# Patient Record
Sex: Female | Born: 1955 | Race: White | Hispanic: No | Marital: Married | Smoking: Former smoker
Health system: Southern US, Community
[De-identification: ages and names within clinical notes are randomized; demographics above are authoritative.]

## PROBLEM LIST (undated history)

## (undated) DIAGNOSIS — G4730 Sleep apnea, unspecified: Secondary | ICD-10-CM

## (undated) DIAGNOSIS — Z9889 Other specified postprocedural states: Secondary | ICD-10-CM

## (undated) DIAGNOSIS — E119 Type 2 diabetes mellitus without complications: Secondary | ICD-10-CM

## (undated) DIAGNOSIS — Z87442 Personal history of urinary calculi: Secondary | ICD-10-CM

## (undated) DIAGNOSIS — M1712 Unilateral primary osteoarthritis, left knee: Secondary | ICD-10-CM

## (undated) DIAGNOSIS — K219 Gastro-esophageal reflux disease without esophagitis: Secondary | ICD-10-CM

## (undated) DIAGNOSIS — I10 Essential (primary) hypertension: Secondary | ICD-10-CM

## (undated) DIAGNOSIS — M2540 Effusion, unspecified joint: Secondary | ICD-10-CM

## (undated) DIAGNOSIS — J189 Pneumonia, unspecified organism: Secondary | ICD-10-CM

## (undated) DIAGNOSIS — Z96659 Presence of unspecified artificial knee joint: Secondary | ICD-10-CM

## (undated) DIAGNOSIS — D649 Anemia, unspecified: Secondary | ICD-10-CM

## (undated) DIAGNOSIS — E785 Hyperlipidemia, unspecified: Secondary | ICD-10-CM

## (undated) DIAGNOSIS — R233 Spontaneous ecchymoses: Secondary | ICD-10-CM

## (undated) DIAGNOSIS — M2550 Pain in unspecified joint: Secondary | ICD-10-CM

## (undated) DIAGNOSIS — K589 Irritable bowel syndrome without diarrhea: Secondary | ICD-10-CM

## (undated) DIAGNOSIS — I2510 Atherosclerotic heart disease of native coronary artery without angina pectoris: Secondary | ICD-10-CM

## (undated) DIAGNOSIS — K5900 Constipation, unspecified: Secondary | ICD-10-CM

## (undated) DIAGNOSIS — G4700 Insomnia, unspecified: Secondary | ICD-10-CM

## (undated) DIAGNOSIS — R51 Headache: Secondary | ICD-10-CM

## (undated) DIAGNOSIS — R011 Cardiac murmur, unspecified: Secondary | ICD-10-CM

## (undated) DIAGNOSIS — M1990 Unspecified osteoarthritis, unspecified site: Secondary | ICD-10-CM

## (undated) DIAGNOSIS — R112 Nausea with vomiting, unspecified: Secondary | ICD-10-CM

## (undated) DIAGNOSIS — R238 Other skin changes: Secondary | ICD-10-CM

## (undated) HISTORY — PX: CORONARY ANGIOPLASTY WITH STENT PLACEMENT: SHX49

## (undated) HISTORY — PX: BREAST LUMPECTOMY: SHX2

## (undated) HISTORY — PX: EYE SURGERY: SHX253

## (undated) HISTORY — PX: CARDIAC CATHETERIZATION: SHX172

## (undated) HISTORY — PX: ABDOMINAL HYSTERECTOMY: SHX81

## (undated) HISTORY — PX: JOINT REPLACEMENT: SHX530

## (undated) HISTORY — PX: OTHER SURGICAL HISTORY: SHX169

## (undated) HISTORY — PX: CHOLECYSTECTOMY: SHX55

## (undated) HISTORY — PX: ESOPHAGOGASTRODUODENOSCOPY: SHX1529

---

## 1985-07-20 HISTORY — PX: LAPAROSCOPY: SHX197

## 1994-07-20 HISTORY — PX: BREAST REDUCTION SURGERY: SHX8

## 1995-07-21 HISTORY — PX: BREAST SURGERY: SHX581

## 2005-07-20 HISTORY — PX: BLADDER REPAIR: SHX76

## 2008-05-18 ENCOUNTER — Inpatient Hospital Stay (HOSPITAL_COMMUNITY): Admission: RE | Admit: 2008-05-18 | Discharge: 2008-05-21 | Payer: Self-pay | Admitting: Orthopedic Surgery

## 2008-05-31 ENCOUNTER — Encounter (INDEPENDENT_AMBULATORY_CARE_PROVIDER_SITE_OTHER): Payer: Self-pay | Admitting: Orthopedic Surgery

## 2008-05-31 ENCOUNTER — Ambulatory Visit (HOSPITAL_COMMUNITY): Admission: RE | Admit: 2008-05-31 | Discharge: 2008-05-31 | Payer: Self-pay | Admitting: Orthopedic Surgery

## 2008-05-31 ENCOUNTER — Ambulatory Visit: Payer: Self-pay | Admitting: Vascular Surgery

## 2008-06-12 ENCOUNTER — Encounter (INDEPENDENT_AMBULATORY_CARE_PROVIDER_SITE_OTHER): Payer: Self-pay | Admitting: Orthopedic Surgery

## 2008-06-12 ENCOUNTER — Ambulatory Visit (HOSPITAL_COMMUNITY): Admission: RE | Admit: 2008-06-12 | Discharge: 2008-06-12 | Payer: Self-pay | Admitting: Orthopedic Surgery

## 2008-07-20 HISTORY — PX: PATELLA REALIGNMENT: SHX2179

## 2009-07-15 ENCOUNTER — Inpatient Hospital Stay (HOSPITAL_COMMUNITY): Admission: RE | Admit: 2009-07-15 | Discharge: 2009-07-18 | Payer: Self-pay | Admitting: Orthopedic Surgery

## 2010-05-04 IMAGING — CR DG CHEST 2V
2 series · 2 of 2 positions shown · non-contrast
Comparison: None

CLINICAL DATA: Preop right total knee arthroplasty.

CHEST - 2 VIEW

[view not recorded (1 of 2)]
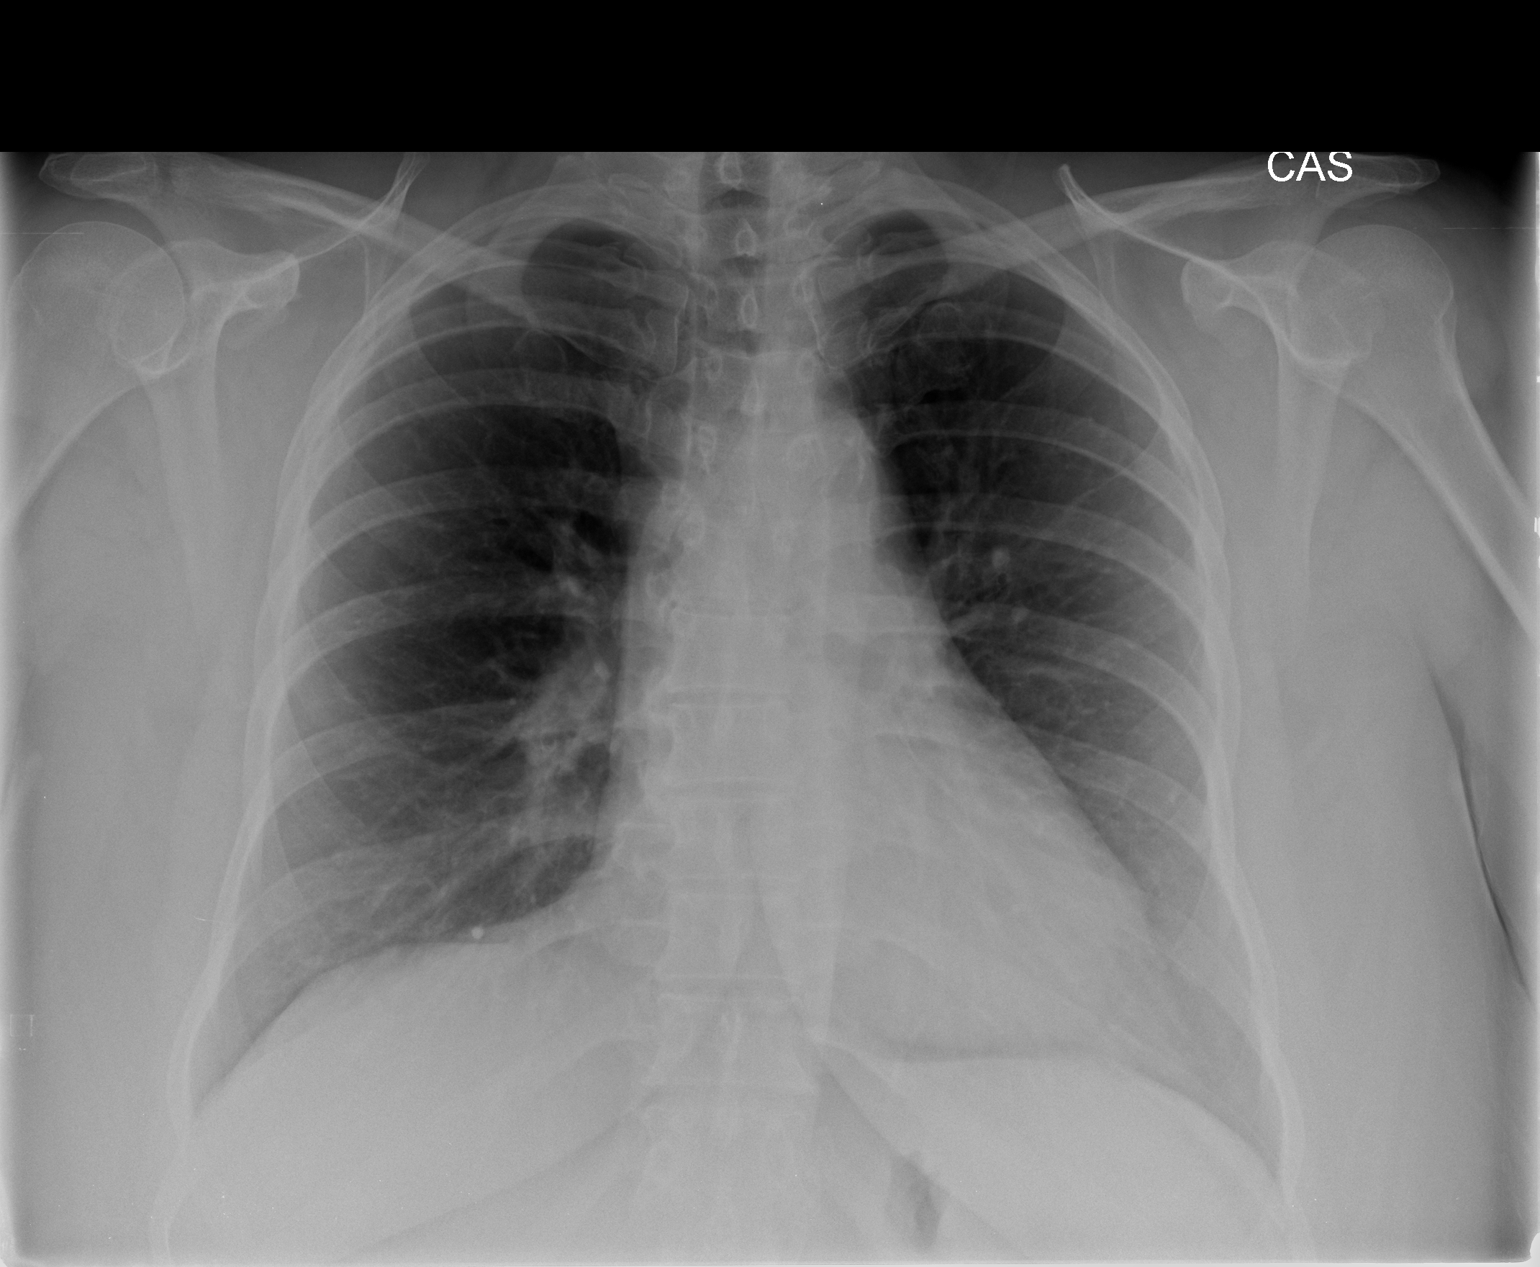

[view not recorded (2 of 2)]
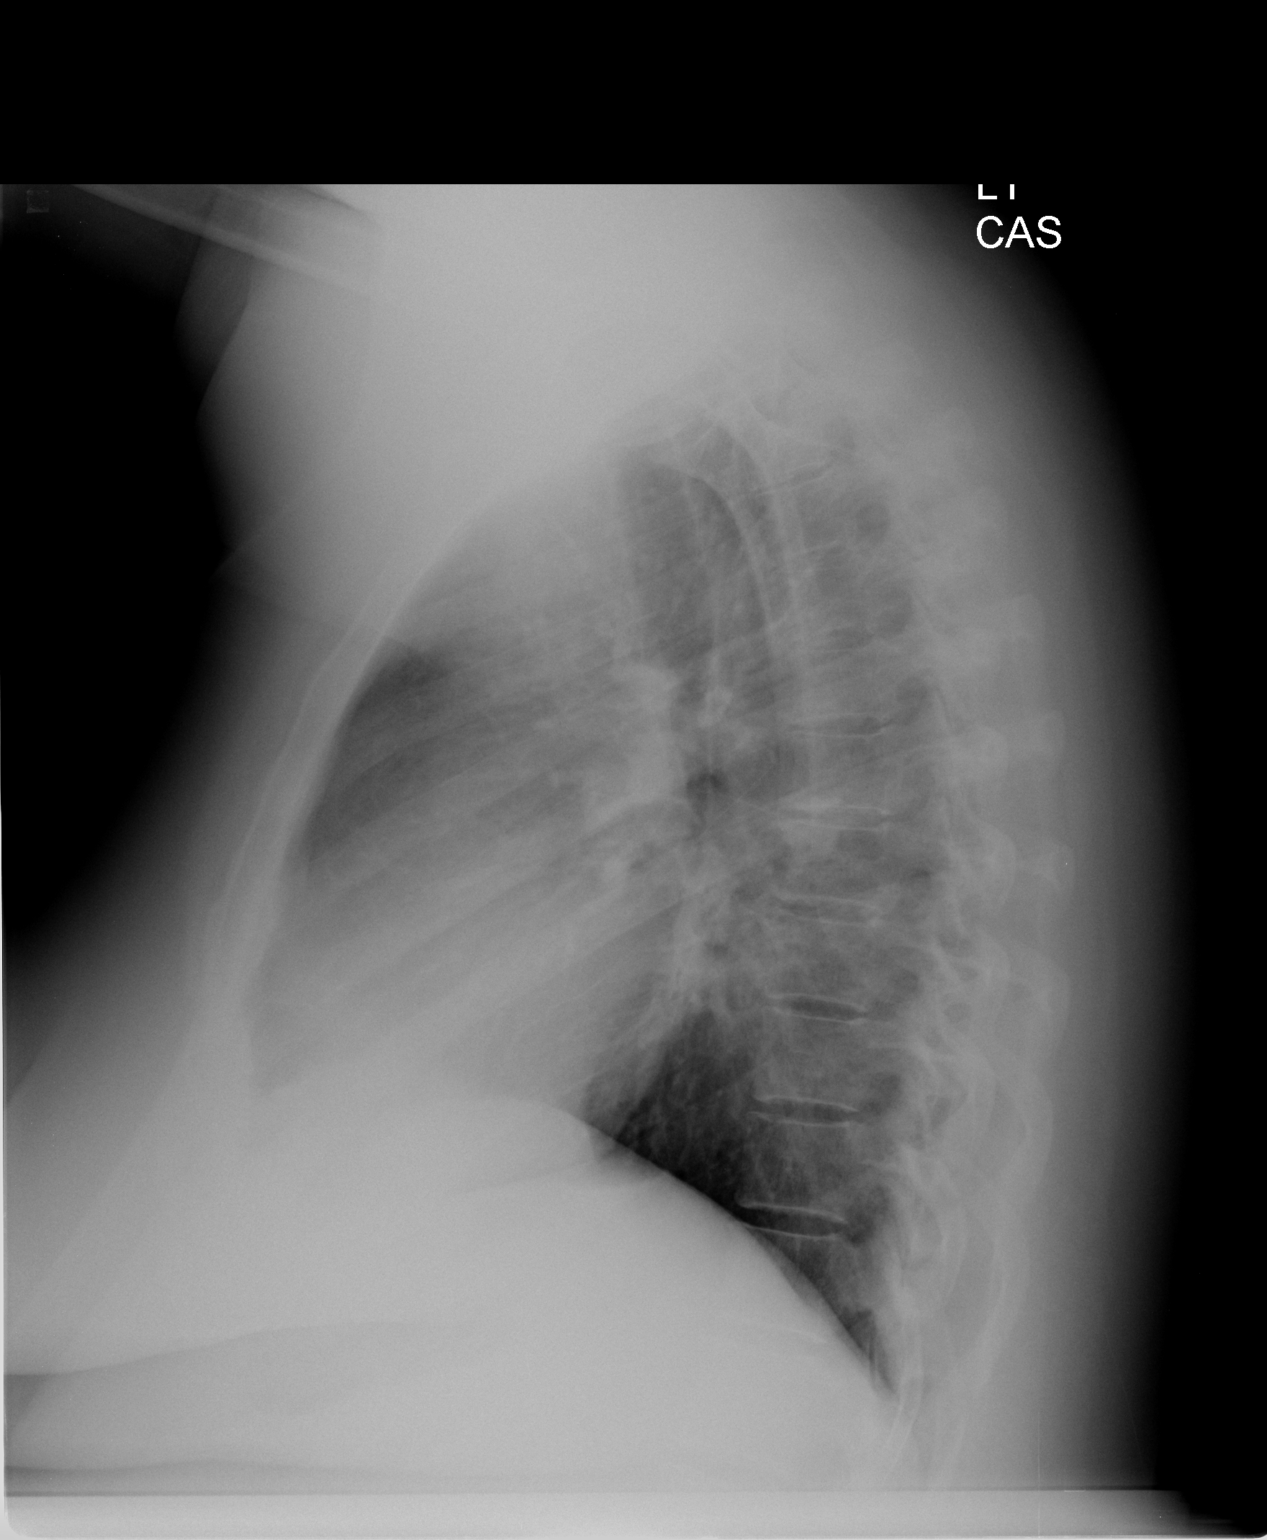

[2 of 2 positions shown; findings below may reference images not displayed]

FINDINGS: Trachea is midline.  Heart is at the upper limits of
normal in size.  Lungs are clear.  No pleural fluid.
IMPRESSION: No acute findings.

## 2010-05-08 IMAGING — CR DG KNEE 1-2V PORT*R*
2 series · 2 of 2 positions shown · non-contrast
Comparison: None available.

CLINICAL DATA: Knee replacement.

PORTABLE RIGHT KNEE - 1-2 VIEW

[view not recorded (1 of 2)]
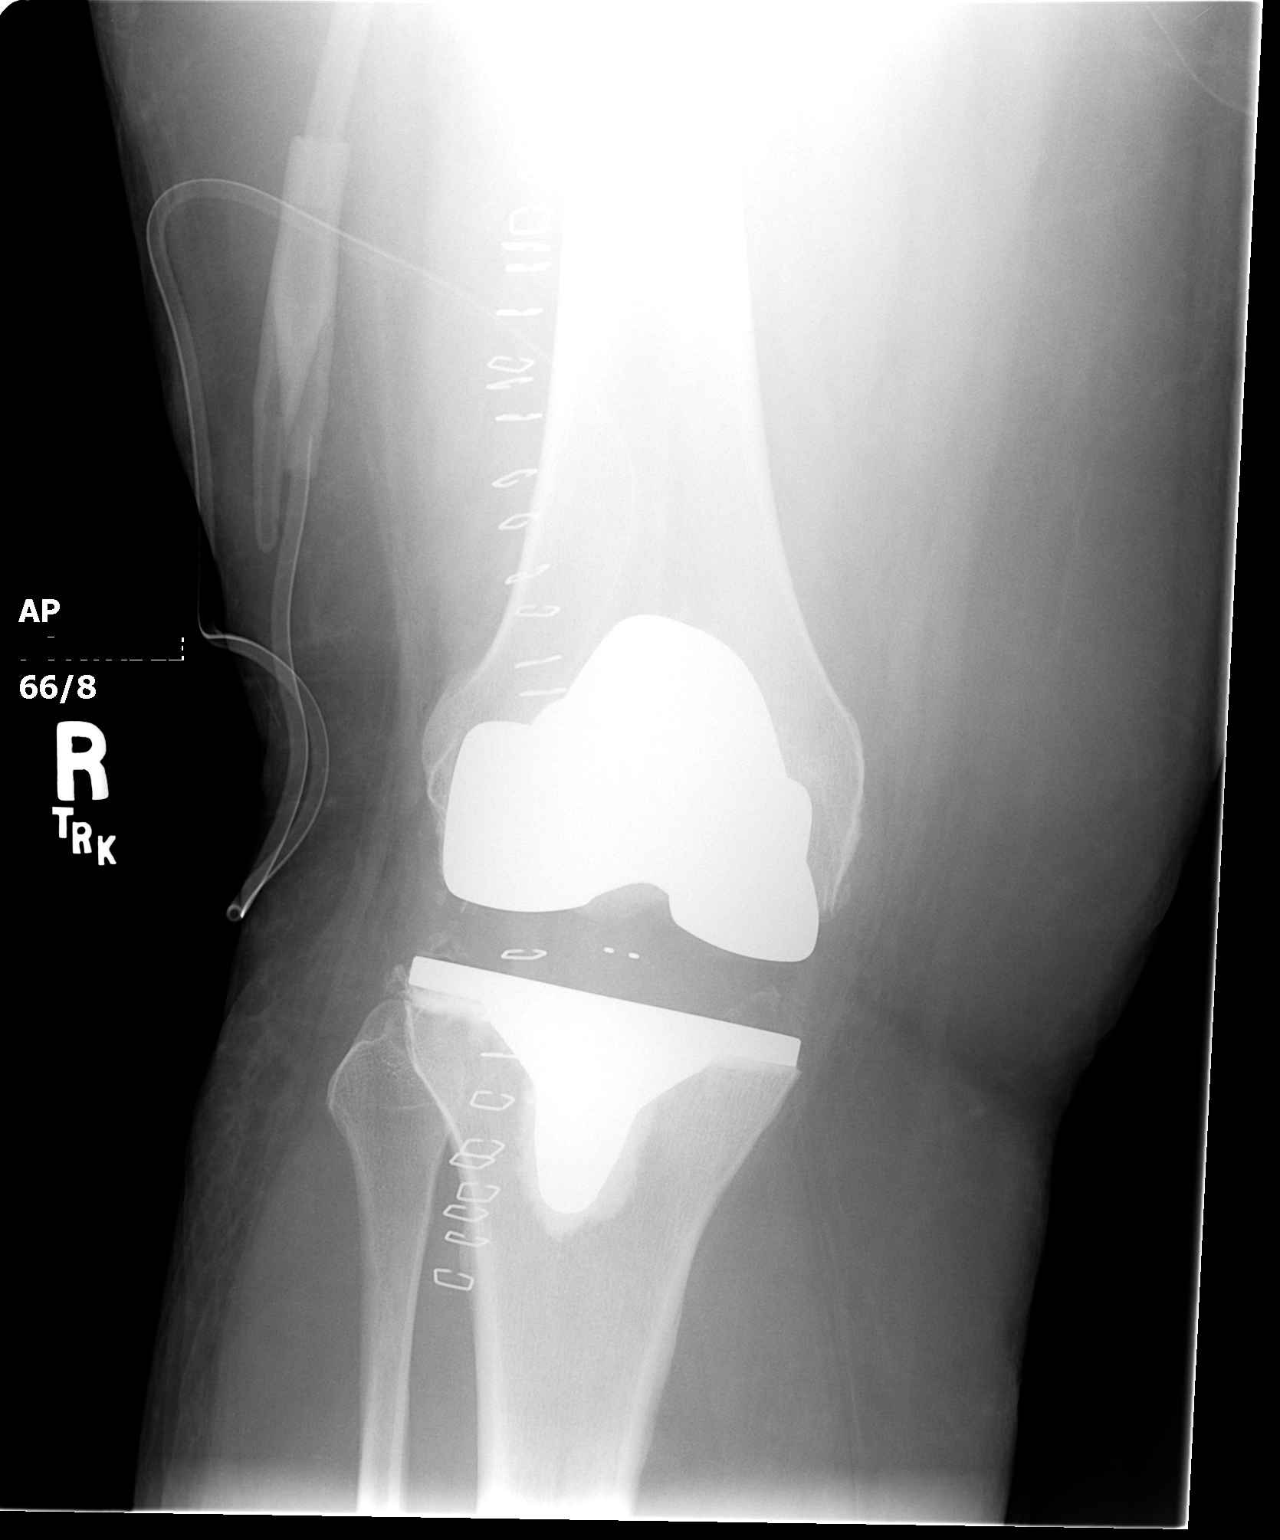

[view not recorded (2 of 2)]
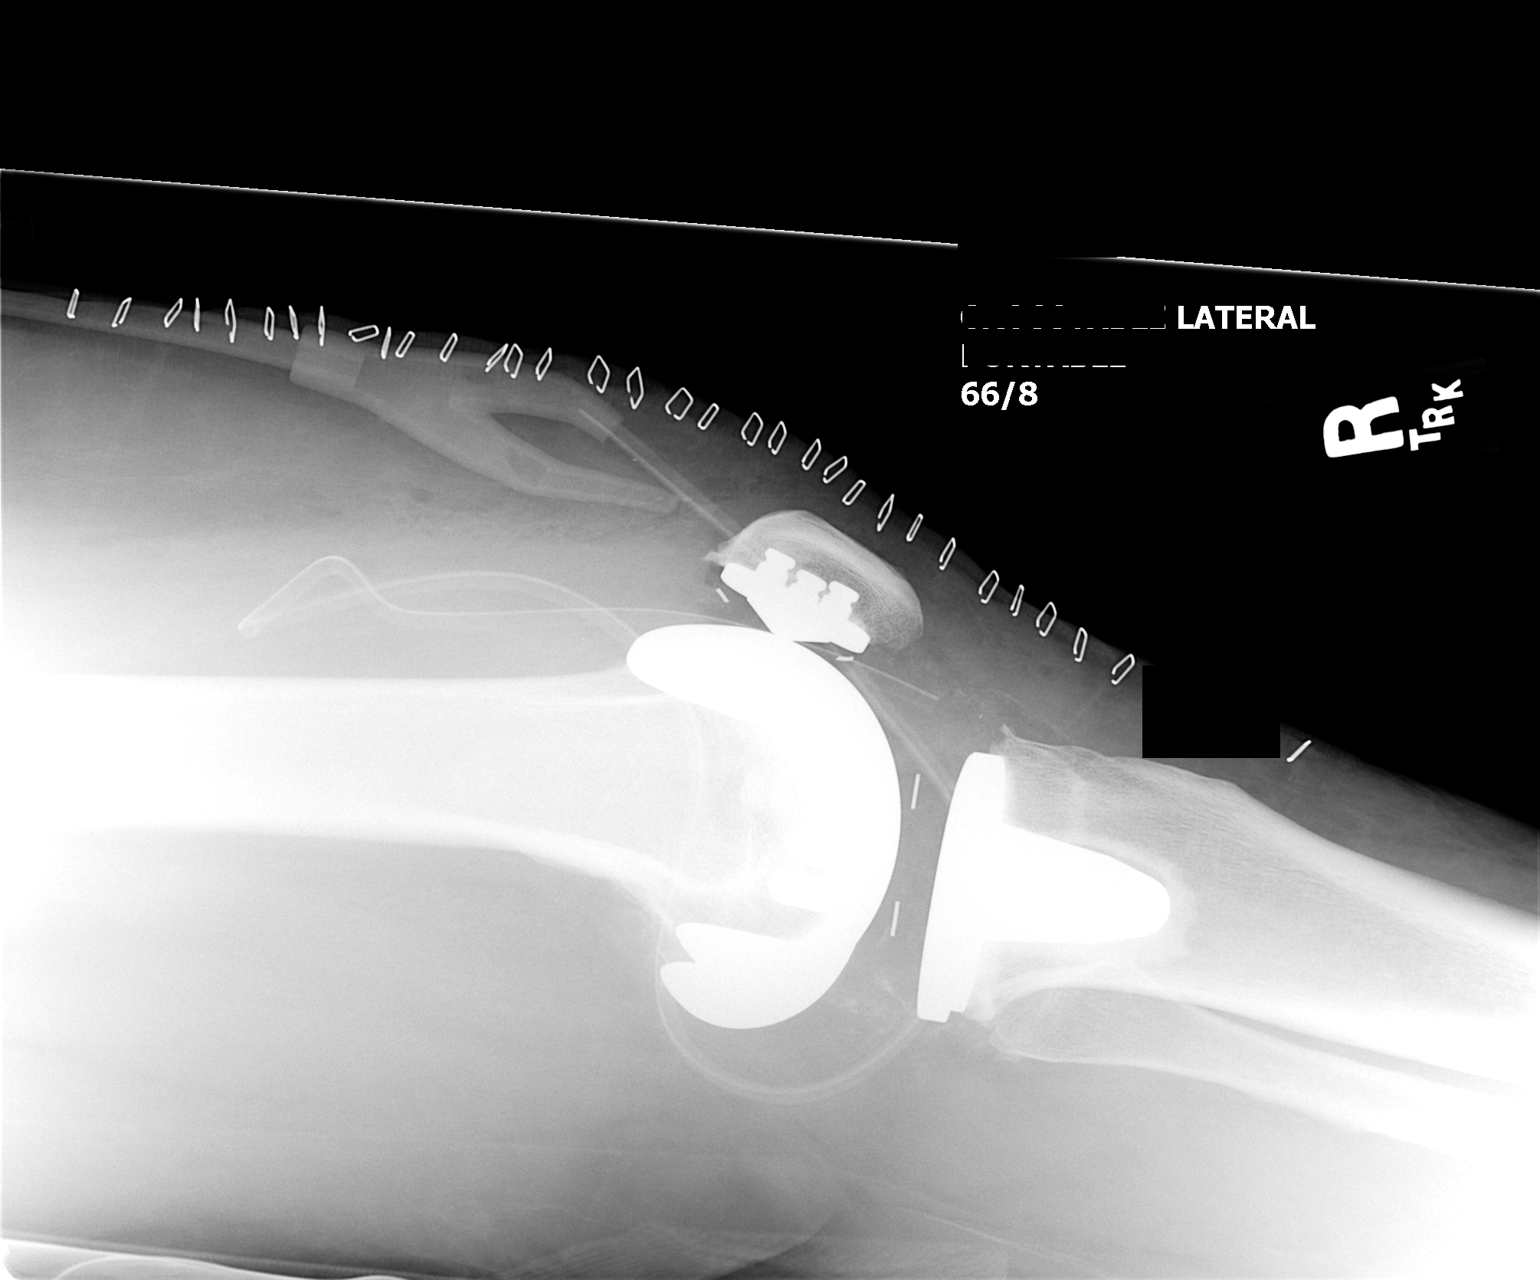

[2 of 2 positions shown; findings below may reference images not displayed]

FINDINGS: The patient has a right total knee arthroplasty with
surgical drain and staples in place.  Device is located and there
is no fracture.
IMPRESSION: Right total knee replacement without evidence of complication.

## 2010-10-20 LAB — URINE CULTURE
Colony Count: NO GROWTH
Culture: NO GROWTH

## 2010-10-20 LAB — URINALYSIS, ROUTINE W REFLEX MICROSCOPIC
Bilirubin Urine: NEGATIVE
Hgb urine dipstick: NEGATIVE
Ketones, ur: NEGATIVE mg/dL
Urobilinogen, UA: 1 mg/dL (ref 0.0–1.0)

## 2010-10-20 LAB — BASIC METABOLIC PANEL
CO2: 29 mEq/L (ref 19–32)
CO2: 31 mEq/L (ref 19–32)
Chloride: 102 mEq/L (ref 96–112)
GFR calc Af Amer: >60 mL/min (ref >60)
GFR calc non Af Amer: >60 mL/min (ref >60)
Glucose, Bld: 132 mg/dL — ABNORMAL HIGH (ref 70–99)
Potassium: 3.3 mEq/L — ABNORMAL LOW (ref 3.5–5.1)
Potassium: 3.7 mEq/L (ref 3.5–5.1)
Sodium: 137 mEq/L (ref 135–145)

## 2010-10-20 LAB — DIFFERENTIAL
Basophils Absolute: 0.1 10*3/uL (ref 0.0–0.1)
Basophils Relative: 1 % (ref 0–1)
Monocytes Absolute: 0.5 10*3/uL (ref 0.1–1.0)
Neutro Abs: 7.2 10*3/uL (ref 1.7–7.7)

## 2010-10-20 LAB — BODY FLUID CULTURE

## 2010-10-20 LAB — COMPREHENSIVE METABOLIC PANEL
ALT: 16 U/L (ref 0–35)
Alkaline Phosphatase: 95 U/L (ref 39–117)
GFR calc Af Amer: >60 mL/min (ref >60)
GFR calc non Af Amer: >60 mL/min (ref >60)
Sodium: 140 mEq/L (ref 135–145)
Total Bilirubin: 0.6 mg/dL (ref 0.3–1.2)
Total Protein: 7.4 g/dL (ref 6.0–8.3)

## 2010-10-20 LAB — CBC
HCT: 29.9 % — ABNORMAL LOW (ref 36.0–46.0)
HCT: 31.8 % — ABNORMAL LOW (ref 36.0–46.0)
HCT: 32.6 % — ABNORMAL LOW (ref 36.0–46.0)
Hemoglobin: 14 g/dL (ref 12.0–15.0)
MCHC: 33.8 g/dL (ref 30.0–36.0)
MCHC: 34.2 g/dL (ref 30.0–36.0)
MCHC: 34.4 g/dL (ref 30.0–36.0)
MCV: 83.3 fL (ref 78.0–100.0)
Platelets: 156 10*3/uL (ref 150–400)
RBC: 3.84 MIL/uL — ABNORMAL LOW (ref 3.87–5.11)
RBC: 3.91 MIL/uL (ref 3.87–5.11)
RBC: 4.99 MIL/uL (ref 3.87–5.11)
RDW: 14.2 % (ref 11.5–15.5)
WBC: 10 10*3/uL (ref 4.0–10.5)
WBC: 11.6 10*3/uL — ABNORMAL HIGH (ref 4.0–10.5)
WBC: 12.5 10*3/uL — ABNORMAL HIGH (ref 4.0–10.5)

## 2010-10-20 LAB — TYPE AND SCREEN: Antibody Screen: NEGATIVE

## 2010-10-20 LAB — ANAEROBIC CULTURE

## 2010-10-20 LAB — PROTIME-INR: INR: 0.93 (ref 0.00–1.49)

## 2010-12-02 NOTE — Op Note (Signed)
NAMEQUANESHA, Abigail Townsend               ACCOUNT NO.:  [id number]   MEDICAL RECORD NO.:  [id number]          PATIENT TYPE:  INP   LOCATION:  5041                         FACILITY:  [facility]   PHYSICIAN:  Dyke Brackett, M.D.    DATE OF BIRTH:  March 13, 1956   DATE OF PROCEDURE:  DATE OF DISCHARGE:                               OPERATIVE REPORT   INDICATION:  A 55 year old female with end-stage arthritis of the right  knee, felt to be amenable to hospitalization for right total knee  replacement.   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   OPERATION:  Right total knee replacement (LCS standard femur with  standard patella, 3-peg size 3 tibia with 50 mm bearing).   SURGEON:  Dyke Brackett, MD   ASSISTANT:  Joslyn Devon. Smith, PA   TOURNIQUET TIME:  Approximately 1 hour and 20 minutes.   DESCRIPTION OF PROCEDURE:  Sterile prep and drape, exsanguination of the  leg, placement of the tourniquet 400, expected skin incision with medial  parapatellar approach was then made and identification of the most  diseased medial compartment with tibial cut, 7 degrees posterior slope  with the appropriate degree of valgus about 2.5 mm below the most  diseased medial compartment.  Stripping and release of the medial side  of the knee was carried out due to the valgus deformity noted.  After  this, the proximal tibial cut and the anterior-posterior femoral cut was  done with a guide system basically flexion gap was measured at 50 mm  followed by distal femoral cut.  A 4-degree valgus made extension gap  matched at 50 mm.  Chamfer cuts were made as well as the keyhole for the  prosthesis and small amounts of osteophytes were removed posterior  aspect of the knee.  PCL was completely the released.  Small remnants of  the menisci were removed.  Tibia was cut with a keyhole touch for the  tibia followed by trial with the patella cut leaving 40 mm from native  patella with 3-peg patella trial with all trials placed tibia and  femoral.  Full extension was noted.  No instability.  Excellent  stability to varus and valgus.  The knee full extended and flexed and  again good stability relative to the bearing.  Trial components were  removed followed by placement of the final components of tibia followed  by femur patella.  Cement was allowed to harden.  Trial bearing had been  placed again.  This was removed.  Tourniquet released.  Small bleeders  were coagulated.  No excess bleeding was noted  also the small amount of cement was removed from the posterior aspect of  the knee.  Final bearing being placed and final stability was checked  and then closure was effected with #1 Ethibond, 2-0 Vicryl skin clips.  Hemovac drain was placed and exited superolaterally in the lateral  gutter, taken to recovery room in stable condition.      Dyke Brackett,  M.D.  Electronically Signed     WDC/MEDQ  D:  05/18/2008  T:  05/18/2008  Job:  [id number]

## 2010-12-05 NOTE — Discharge Summary (Signed)
[name], Townsend               ACCOUNT NO.:  [id number]   MEDICAL RECORD NO.:  [id number]          PATIENT TYPE:  INP   LOCATION:  5041                         FACILITY:  [facility]   PHYSICIAN:  Dyke Brackett, M.D.    DATE OF BIRTH:  1955-10-25   DATE OF ADMISSION:  05/18/2008  DATE OF DISCHARGE:  05/21/2008                               DISCHARGE SUMMARY   ADMISSION DIAGNOSIS:  Right knee osteoarthritis.   FINAL DIAGNOSIS:  Status post right total knee replacement, right knee  osteoarthritis.   PROCEDURE:  Right total knee replacement.   HOSPITAL COURSE:  The patient was admitted to hospital on May 18, 2008 for right total knee replacement.  She was, following the  procedure, transferred to the PACU in stable condition then  to the  orthopedic floor on 5000.  Fisrst postop day, go to May 19, 2008,  the patient was doing well, stable condition, seen by Dr. Madelon Townsend.  No  complaints.  Continuing CPM machine.  Postoperative day #2 was on  May 20, 2008, the patient's hemoglobin was 9.3.  Vital signs stable,  afebrile.  She was not having problems with lightheadedness or  dizziness.  Doing well with physical therapy.  Drain was pulled.  Dressing was changed and CPM0 to 90.  Right lower extremity  neurovascularly intact.  Postoperative day #3 was on May 21, 2008,  the patient was still doing well.  Vital signs were stable, afebrile.  Her hemoglobin was 9.4, calcium was at 3.3.  Right lower extremity  neurovascularly intact.  No chest pain, no trouble breathing.  No signs  of infection, well healing skin incision, dressing was changed.  Abdomen  soft, nontender.  She is ready to go home.  After which, she was  discharged home on May 21, 2008 on postoperative #3.   ASSESSMENT AND PLAN:  The patient is status post right total knee  replacement and discharged to home on May 21, 2008 on postoperative  day #3.  She is 50% weightbearing using a knee immobilizer and  tried to  do a straight leg raise.  Plan for discharge in knee immobilizer for  physical therapy, plan for repeat office visit in 10-12 days for staple  removal.  Diet was regular.  The patient was in stable condition as  mentioned in final diagnosis with right knee osteoarthritis.  Status  post right total knee replacement.   DISCHARGE MEDICATIONS:  1. Percocet 5/325 1 tablet p.o. q.4-6 h. p.r.n. pain.  2. Robaxin 500 mg 1 tablet p.o. q.6-8 h. p.r.n. pain.  3. Lovenox 40 mg subcu 24 hours for another total of 15 days      postoperative.  4. Norvasc 10 mg p.o. daily.  5. Topamax 200 mg b.i.d.  6. Lyrica 150 mg b.i.d.  7. Indocin 50 mg b.i.d.  8. Detrol LA 4 mg daily.  9. Maxzide 37.5 mg daily.  10.K-Dur 20 mEq daily.  11.Zanaflex 2 mg at bedtime.  12.Lipitor 80 mg at bedtime.  13.Vitamin C 1000 mg daily.  14.Vitamin E 400 mg daily.  15.__________ 400 mcg daily.  16.Fish oil 1000 mg daily.  17.Fish oil 2000 mg at bedtime.  18.Multivitamin at bedtime.  19.Loratadine 10 mg daily.  20.Citracal 1000 mg daily.  21.Maxalt 10 mg daily as needed.  22.Climara 0.025 mg topically every week.      Sharol Given, PA      Dyke Brackett, M.D.  Electronically Signed    JBS/MEDQ  D:  07/04/2008  T:  07/05/2008  Job:  [id number]

## 2011-04-21 ENCOUNTER — Other Ambulatory Visit: Payer: Self-pay | Admitting: Orthopedic Surgery

## 2011-04-21 DIAGNOSIS — M25512 Pain in left shoulder: Secondary | ICD-10-CM

## 2011-04-21 LAB — COMPREHENSIVE METABOLIC PANEL
Albumin: 3.9
Alkaline Phosphatase: 89
Calcium: 9.5
Glucose, Bld: 127 — ABNORMAL HIGH
Potassium: 3.4 — ABNORMAL LOW
Total Protein: 7.3

## 2011-04-21 LAB — CBC
HCT: 26.7 — ABNORMAL LOW
HCT: 27.4 — ABNORMAL LOW
Hemoglobin: 10.3 — ABNORMAL LOW
Hemoglobin: 13.9
Hemoglobin: 9.3 — ABNORMAL LOW
Hemoglobin: 9.4 — ABNORMAL LOW
MCHC: 34.2
MCHC: 35.3
MCV: 82.3
MCV: 80.1
Platelets: 159
RBC: 3.67 — ABNORMAL LOW
RBC: 3.29 — ABNORMAL LOW
RDW: 14.9
RDW: 14.9
RDW: 14.4
RDW: 14.6
WBC: 11.4 — ABNORMAL HIGH

## 2011-04-21 LAB — URINALYSIS, ROUTINE W REFLEX MICROSCOPIC
Bilirubin Urine: NEGATIVE
Glucose, UA: NEGATIVE
Hgb urine dipstick: NEGATIVE
Specific Gravity, Urine: 1.017

## 2011-04-21 LAB — BASIC METABOLIC PANEL
BUN: 12
CO2: 29
CO2: 29
Calcium: 8.5
Creatinine, Ser: 0.76
GFR calc Af Amer: >60
GFR calc non Af Amer: >60
GFR calc non Af Amer: >60
GFR calc non Af Amer: >60
Glucose, Bld: 191 — ABNORMAL HIGH
Glucose, Bld: 195 — ABNORMAL HIGH
Potassium: 3.1 — ABNORMAL LOW
Potassium: 3.3 — ABNORMAL LOW
Potassium: 3.3 — ABNORMAL LOW
Sodium: 137

## 2011-04-21 LAB — DIFFERENTIAL
Basophils Absolute: 0.1
Eosinophils Absolute: 0.4
Lymphocytes Relative: 27
Lymphs Abs: 3.1
Monocytes Absolute: 0.4
Neutrophils Relative %: 65

## 2011-04-21 LAB — URINE CULTURE: Colony Count: 6000

## 2011-04-21 LAB — TYPE AND SCREEN: ABO/RH(D): O POS

## 2011-04-21 LAB — PROTIME-INR: Prothrombin Time: 12.3

## 2011-04-26 ENCOUNTER — Ambulatory Visit
Admission: RE | Admit: 2011-04-26 | Discharge: 2011-04-26 | Disposition: A | Discharge status: Home / Self Care | Payer: BC Managed Care – PPO | Source: Ambulatory Visit | Attending: Orthopedic Surgery | Admitting: Orthopedic Surgery

## 2011-04-26 DIAGNOSIS — M25512 Pain in left shoulder: Secondary | ICD-10-CM

## 2011-05-29 ENCOUNTER — Encounter (HOSPITAL_COMMUNITY): Payer: Self-pay | Admitting: Pharmacy Technician

## 2011-06-03 ENCOUNTER — Encounter (HOSPITAL_COMMUNITY)
Admission: RE | Admit: 2011-06-03 | Discharge: 2011-06-03 | Disposition: A | Discharge status: Home / Self Care | Payer: BC Managed Care – PPO | Source: Ambulatory Visit | Attending: Orthopedic Surgery | Admitting: Orthopedic Surgery

## 2011-06-03 ENCOUNTER — Encounter (HOSPITAL_COMMUNITY): Payer: Self-pay

## 2011-06-03 HISTORY — DX: Gastro-esophageal reflux disease without esophagitis: K21.9

## 2011-06-03 HISTORY — DX: Unspecified osteoarthritis, unspecified site: M19.90

## 2011-06-03 HISTORY — DX: Essential (primary) hypertension: I10

## 2011-06-03 HISTORY — DX: Headache: R51

## 2011-06-03 HISTORY — DX: Cardiac murmur, unspecified: R01.1

## 2011-06-03 HISTORY — DX: Sleep apnea, unspecified: G47.30

## 2011-06-03 LAB — BASIC METABOLIC PANEL WITH GFR
BUN: 22 mg/dL (ref 6–23)
CO2: 27 meq/L (ref 19–32)
Calcium: 9.7 mg/dL (ref 8.4–10.5)
Chloride: 105 meq/L (ref 96–112)
Creatinine, Ser: 0.8 mg/dL (ref 0.50–1.10)
GFR calc Af Amer: >90 mL/min
GFR calc non Af Amer: 81 mL/min — ABNORMAL LOW
Glucose, Bld: 148 mg/dL — ABNORMAL HIGH (ref 70–99)
Potassium: 3.9 meq/L (ref 3.5–5.1)
Sodium: 142 meq/L (ref 135–145)

## 2011-06-03 LAB — CBC
MCH: 26.9 pg (ref 26.0–34.0)
MCHC: 32.5 g/dL (ref 30.0–36.0)
MCV: 82.7 fL (ref 78.0–100.0)
Platelets: 165 10*3/uL (ref 150–400)
RDW: 14.6 % (ref 11.5–15.5)

## 2011-06-03 LAB — SURGICAL PCR SCREEN
MRSA, PCR: NEGATIVE
Staphylococcus aureus: POSITIVE — AB

## 2011-06-03 NOTE — Pre-Procedure Instructions (Signed)
[name]  06/03/2011   Your procedure is scheduled on:  Friday 06/05/11   Report to [facility] at 830 AM.  Call this number if you have problems the morning of surgery: [phone]   Remember:   Do not eat food:After Midnight.  Do not drink clear liquids: 4 Hours before arrival.  Take these medicines the morning of surgery with A SIP OF WATER: CLARITIN,  METOPROLOL,  LYRICA, TOPAMAX   Do not wear jewelry, make-up or nail polish.  Do not wear lotions, powders, or perfumes. You may wear deodorant.  Do not shave 48 hours prior to surgery.  Do not bring valuables to the hospital.  Contacts, dentures or bridgework may not be worn into surgery.  Leave suitcase in the car. After surgery it may be brought to your room.  For patients admitted to the hospital, checkout time is 11:00 AM the day of discharge.   Patients discharged the day of surgery will not be allowed to drive home.  Name and phone number of your driver:  MICHAEL   Special Instructions: CHG Shower Use Special Wash: 1/2 bottle night before surgery and 1/2 bottle morning of surgery.   Please read over the following fact sheets that you were given: Pain Booklet and MRSA Information

## 2011-06-03 NOTE — Progress Notes (Signed)
REQUESTED STRESS TEST ALREADY, ALSO REQ EVENT MONITOR TEST, SLEEP STUDY, ECHO FROM [facility]

## 2011-06-04 NOTE — Consult Note (Signed)
Anesthesia:  Patient is a 55 year old female for a left shoulder arthroscopy, decompression, and possible biceps tendon repair.  I was asked to review her EKG and cardiac records.  Her hx is significant for placement of 2 DES (LAD and left CX) in April of this year while staying with family in [place].  She was placed on Effient and baby ASA daily.  Her primary Cardiologist is Dr. Daryel November at [facility] [phone]).  Since her stents were placed she underwent another cardiac cath on 12/02/10 for recurrent CP.  This showed no in-stent restenosis of her stents and "basically near normal coronary arteries" elsewhere, and normal LV systolic function although with evidence of elevated LV end-diastolic pressure possibly due to diastolic dysfunction.  She also had an echocardiogram showing EF 60-65%, normal RV size and systolic function, mild LVH.  As part of her w/u for recurrent CP in May, she also ultimately had a stress test on 04/17/11 which showed no reversible ischemia.  Continued medical therapy was recommended with one year follow-up.  She has had no further chest pain.  After review of her chart and speaking with both her and Dr. Candise Bowens scheduler, it was learned that the patient had been instructed to stop both her ASA and Effient 1 week prior to surgery.  I called and personally spoke with Dr. Daryel November who, at the time, was unaware of the planned procedure.  He had hoped to have Ms. Riggins continue her Effient for a full year before considering its discontinuation.  However, since the Effient had been stopped for nearly a week, he felt the adverse effects, if any, would have already taken place.  He told me that the patient needed to restart her ASA 81 mg today and, in fact, take two pills today and in the morning.  She could then resume her usual dose of ASA and Effient post operatively.  If Dr. Madelon Lips could not perform the planned procedure while the patient was on her ASA  regimen then he felt the procedure should be cancelled.  However, Dr. Hyacinth Meeker did say that if she could resume the ASA regimen as previously mentioned then she could proceed.  I spoke with Dr. Candise Bowens physician assistant Josh who spoke with Dr. Madelon Lips and they will call Ms. Fowers and have her resume her ASA.  I have also spoken with the patient and gave her Dr. Rondel Baton instructions.    Her labs were noted.  I will order a 12 lead EKG pre-operatively, as the EKG copies from [facility] are quite dark.  She is also for a 2V CXR.  Plan to proceed if the patient is compliant with her instructions and if no recurrent CV symptoms.

## 2011-06-04 NOTE — Progress Notes (Signed)
Chart to Shonna Chock PA to review EKG and cardiac notes on chart.

## 2011-06-05 ENCOUNTER — Ambulatory Visit (HOSPITAL_COMMUNITY)
Admission: RE | Admit: 2011-06-05 | Discharge: 2011-06-06 | Disposition: A | Discharge status: Home / Self Care | Payer: BC Managed Care – PPO | Source: Ambulatory Visit | Attending: Orthopedic Surgery | Admitting: Orthopedic Surgery

## 2011-06-05 ENCOUNTER — Encounter (HOSPITAL_COMMUNITY): Payer: Self-pay | Admitting: *Deleted

## 2011-06-05 ENCOUNTER — Other Ambulatory Visit: Payer: Self-pay

## 2011-06-05 ENCOUNTER — Encounter (HOSPITAL_COMMUNITY)
Admission: RE | Disposition: A | Discharge status: Home / Self Care | Payer: Self-pay | Source: Ambulatory Visit | Attending: Orthopedic Surgery

## 2011-06-05 ENCOUNTER — Encounter (HOSPITAL_COMMUNITY): Payer: Self-pay | Admitting: Orthopedic Surgery

## 2011-06-05 ENCOUNTER — Encounter (HOSPITAL_COMMUNITY): Payer: Self-pay | Admitting: Vascular Surgery

## 2011-06-05 ENCOUNTER — Ambulatory Visit (HOSPITAL_COMMUNITY): Payer: BC Managed Care – PPO | Admitting: Vascular Surgery

## 2011-06-05 ENCOUNTER — Ambulatory Visit (HOSPITAL_COMMUNITY): Payer: BC Managed Care – PPO

## 2011-06-05 DIAGNOSIS — G4730 Sleep apnea, unspecified: Secondary | ICD-10-CM | POA: Insufficient documentation

## 2011-06-05 DIAGNOSIS — Z01810 Encounter for preprocedural cardiovascular examination: Secondary | ICD-10-CM | POA: Insufficient documentation

## 2011-06-05 DIAGNOSIS — M1710 Unilateral primary osteoarthritis, unspecified knee: Secondary | ICD-10-CM | POA: Insufficient documentation

## 2011-06-05 DIAGNOSIS — M67919 Unspecified disorder of synovium and tendon, unspecified shoulder: Secondary | ICD-10-CM | POA: Insufficient documentation

## 2011-06-05 DIAGNOSIS — M25819 Other specified joint disorders, unspecified shoulder: Secondary | ICD-10-CM | POA: Insufficient documentation

## 2011-06-05 DIAGNOSIS — R51 Headache: Secondary | ICD-10-CM | POA: Insufficient documentation

## 2011-06-05 DIAGNOSIS — I10 Essential (primary) hypertension: Secondary | ICD-10-CM | POA: Insufficient documentation

## 2011-06-05 DIAGNOSIS — J45909 Unspecified asthma, uncomplicated: Secondary | ICD-10-CM | POA: Insufficient documentation

## 2011-06-05 DIAGNOSIS — I209 Angina pectoris, unspecified: Secondary | ICD-10-CM | POA: Insufficient documentation

## 2011-06-05 DIAGNOSIS — I2510 Atherosclerotic heart disease of native coronary artery without angina pectoris: Secondary | ICD-10-CM | POA: Insufficient documentation

## 2011-06-05 DIAGNOSIS — M7542 Impingement syndrome of left shoulder: Secondary | ICD-10-CM

## 2011-06-05 DIAGNOSIS — M719 Bursopathy, unspecified: Secondary | ICD-10-CM | POA: Insufficient documentation

## 2011-06-05 DIAGNOSIS — Z01812 Encounter for preprocedural laboratory examination: Secondary | ICD-10-CM | POA: Insufficient documentation

## 2011-06-05 DIAGNOSIS — M24119 Other articular cartilage disorders, unspecified shoulder: Secondary | ICD-10-CM | POA: Insufficient documentation

## 2011-06-05 DIAGNOSIS — K219 Gastro-esophageal reflux disease without esophagitis: Secondary | ICD-10-CM | POA: Insufficient documentation

## 2011-06-05 SURGERY — SHOULDER ARTHROSCOPY WITH SUBACROMIAL DECOMPRESSION
Anesthesia: General | Site: Shoulder | Laterality: Left | Wound class: Clean

## 2011-06-05 MED ORDER — ENALAPRIL MALEATE 5 MG PO TABS
5.0000 mg | ORAL_TABLET | Freq: Every day | ORAL | Status: DC
  Administered 2011-06-05: 5 mg via ORAL
  Filled 2011-06-05 (×3): qty 1

## 2011-06-05 MED ORDER — DIPHENHYDRAMINE HCL 12.5 MG/5ML PO ELIX
12.5000 mg | ORAL_SOLUTION | ORAL | Status: DC | PRN
  Filled 2011-06-05: qty 10

## 2011-06-05 MED ORDER — POLYETHYLENE GLYCOL 3350 17 G PO PACK
17.0000 g | PACK | Freq: Every day | ORAL | Status: DC | PRN
  Filled 2011-06-05: qty 1

## 2011-06-05 MED ORDER — PHENYLEPHRINE HCL 10 MG/ML IJ SOLN
20.0000 mg | INTRAVENOUS | Status: DC | PRN
  Administered 2011-06-05: 5 ug/min via INTRAVENOUS

## 2011-06-05 MED ORDER — FENTANYL CITRATE 0.05 MG/ML IJ SOLN
50.0000 ug | INTRAMUSCULAR | Status: DC | PRN
  Administered 2011-06-05: 50 ug via INTRAVENOUS

## 2011-06-05 MED ORDER — LORATADINE 10 MG PO TABS
10.0000 mg | ORAL_TABLET | Freq: Every day | ORAL | Status: DC
  Administered 2011-06-05 – 2011-06-06 (×2): 10 mg via ORAL
  Filled 2011-06-05 (×2): qty 1

## 2011-06-05 MED ORDER — FLEET ENEMA 7-19 GM/118ML RE ENEM: 1.0000 | ENEMA | Freq: Every day | RECTAL | Status: DC | PRN

## 2011-06-05 MED ORDER — TOPIRAMATE 100 MG PO TABS
200.0000 mg | ORAL_TABLET | Freq: Two times a day (BID) | ORAL | Status: DC
  Administered 2011-06-05 – 2011-06-06 (×2): 200 mg via ORAL
  Filled 2011-06-05 (×3): qty 2

## 2011-06-05 MED ORDER — MIDAZOLAM HCL 2 MG/2ML IJ SOLN
INTRAMUSCULAR | Status: AC
  Filled 2011-06-05: qty 2

## 2011-06-05 MED ORDER — ONDANSETRON HCL 4 MG/2ML IJ SOLN
INTRAMUSCULAR | Status: DC | PRN
  Administered 2011-06-05: 4 mg via INTRAVENOUS

## 2011-06-05 MED ORDER — LACTATED RINGERS IV SOLN
INTRAVENOUS | Status: DC | PRN
  Administered 2011-06-05 (×2): via INTRAVENOUS

## 2011-06-05 MED ORDER — OXYCODONE-ACETAMINOPHEN 5-325 MG PO TABS
1.0000 | ORAL_TABLET | ORAL | Status: DC | PRN
  Administered 2011-06-05: 1 via ORAL
  Administered 2011-06-05 – 2011-06-06 (×3): 2 via ORAL
  Filled 2011-06-05 (×3): qty 2
  Filled 2011-06-05: qty 1

## 2011-06-05 MED ORDER — METOPROLOL TARTRATE 25 MG PO TABS
25.0000 mg | ORAL_TABLET | Freq: Every day | ORAL | Status: DC
  Filled 2011-06-05 (×2): qty 1

## 2011-06-05 MED ORDER — TRIAMTERENE-HCTZ 37.5-25 MG PO TABS
1.0000 | ORAL_TABLET | Freq: Every day | ORAL | Status: DC
  Filled 2011-06-05: qty 1

## 2011-06-05 MED ORDER — SODIUM CHLORIDE 0.9 % IV SOLN
INTRAVENOUS | Status: DC
  Administered 2011-06-06: 02:00:00 via INTRAVENOUS

## 2011-06-05 MED ORDER — HYDROMORPHONE HCL PF 1 MG/ML IJ SOLN: 0.5000 mg | INTRAMUSCULAR | Status: DC | PRN

## 2011-06-05 MED ORDER — BISACODYL 10 MG RE SUPP: 10.0000 mg | Freq: Every day | RECTAL | Status: DC | PRN

## 2011-06-05 MED ORDER — OXYCODONE-ACETAMINOPHEN 5-325 MG PO TABS: ORAL_TABLET | ORAL | Status: DC

## 2011-06-05 MED ORDER — METHOCARBAMOL 100 MG/ML IJ SOLN
500.0000 mg | Freq: Four times a day (QID) | INTRAVENOUS | Status: DC | PRN
  Filled 2011-06-05: qty 5

## 2011-06-05 MED ORDER — METOCLOPRAMIDE HCL 5 MG/ML IJ SOLN
5.0000 mg | Freq: Three times a day (TID) | INTRAMUSCULAR | Status: DC | PRN
  Filled 2011-06-05: qty 2

## 2011-06-05 MED ORDER — METHOCARBAMOL 500 MG PO TABS
500.0000 mg | ORAL_TABLET | Freq: Four times a day (QID) | ORAL | Status: DC | PRN
  Administered 2011-06-05 – 2011-06-06 (×2): 500 mg via ORAL
  Filled 2011-06-05 (×2): qty 1

## 2011-06-05 MED ORDER — SUFENTANIL CITRATE 50 MCG/ML IV SOLN
INTRAVENOUS | Status: DC | PRN
  Administered 2011-06-05: 15 ug via INTRAVENOUS

## 2011-06-05 MED ORDER — METHOCARBAMOL 500 MG PO TABS: 500.0000 mg | ORAL_TABLET | Freq: Four times a day (QID) | ORAL | Status: AC

## 2011-06-05 MED ORDER — LACTATED RINGERS IV SOLN
INTRAVENOUS | Status: DC
  Administered 2011-06-05: 10:00:00 via INTRAVENOUS

## 2011-06-05 MED ORDER — ROSUVASTATIN CALCIUM 20 MG PO TABS
20.0000 mg | ORAL_TABLET | Freq: Every day | ORAL | Status: DC
  Administered 2011-06-05: 20 mg via ORAL
  Filled 2011-06-05 (×2): qty 1

## 2011-06-05 MED ORDER — METOCLOPRAMIDE HCL 10 MG PO TABS: 5.0000 mg | ORAL_TABLET | Freq: Three times a day (TID) | ORAL | Status: DC | PRN

## 2011-06-05 MED ORDER — ACETAMINOPHEN 650 MG RE SUPP: 650.0000 mg | Freq: Four times a day (QID) | RECTAL | Status: DC | PRN

## 2011-06-05 MED ORDER — ACETAMINOPHEN 325 MG PO TABS: 650.0000 mg | ORAL_TABLET | Freq: Four times a day (QID) | ORAL | Status: DC | PRN

## 2011-06-05 MED ORDER — NEOSTIGMINE METHYLSULFATE 1 MG/ML IJ SOLN
INTRAMUSCULAR | Status: DC | PRN
  Administered 2011-06-05: 5 mg via INTRAVENOUS

## 2011-06-05 MED ORDER — BUPIVACAINE-EPINEPHRINE PF 0.5-1:200000 % IJ SOLN
INTRAMUSCULAR | Status: DC | PRN
  Administered 2011-06-05: 30 mL

## 2011-06-05 MED ORDER — GLYCOPYRROLATE 0.2 MG/ML IJ SOLN
INTRAMUSCULAR | Status: DC | PRN
  Administered 2011-06-05: .7 mg via INTRAVENOUS

## 2011-06-05 MED ORDER — MIDAZOLAM HCL 2 MG/2ML IJ SOLN
1.0000 mg | INTRAMUSCULAR | Status: DC | PRN
  Administered 2011-06-05: 1 mg via INTRAVENOUS

## 2011-06-05 MED ORDER — ONDANSETRON HCL 4 MG/2ML IJ SOLN: 4.0000 mg | Freq: Four times a day (QID) | INTRAMUSCULAR | Status: DC | PRN

## 2011-06-05 MED ORDER — ASPIRIN EC 81 MG PO TBEC
81.0000 mg | DELAYED_RELEASE_TABLET | Freq: Every day | ORAL | Status: DC
  Administered 2011-06-06: 81 mg via ORAL
  Filled 2011-06-05: qty 1

## 2011-06-05 MED ORDER — CEFAZOLIN SODIUM 1-5 GM-% IV SOLN
INTRAVENOUS | Status: AC
  Filled 2011-06-05: qty 100

## 2011-06-05 MED ORDER — DOCUSATE SODIUM 100 MG PO CAPS
100.0000 mg | ORAL_CAPSULE | Freq: Two times a day (BID) | ORAL | Status: DC
  Administered 2011-06-06: 100 mg via ORAL
  Filled 2011-06-05 (×2): qty 1

## 2011-06-05 MED ORDER — CEFAZOLIN SODIUM 1-5 GM-% IV SOLN
INTRAVENOUS | Status: DC | PRN
  Administered 2011-06-05: 2 g via INTRAVENOUS

## 2011-06-05 MED ORDER — ASPIRIN 81 MG PO TABS: 81.0000 mg | ORAL_TABLET | Freq: Every day | ORAL | Status: DC

## 2011-06-05 MED ORDER — ESTRADIOL 0.025 MG/24HR TD PTWK
0.0250 mg | MEDICATED_PATCH | TRANSDERMAL | Status: DC
  Administered 2011-06-05: 0.025 mg via TRANSDERMAL
  Filled 2011-06-05: qty 1

## 2011-06-05 MED ORDER — FENTANYL CITRATE 0.05 MG/ML IJ SOLN
INTRAMUSCULAR | Status: AC
  Filled 2011-06-05: qty 2

## 2011-06-05 MED ORDER — ROCURONIUM BROMIDE 100 MG/10ML IV SOLN
INTRAVENOUS | Status: DC | PRN
  Administered 2011-06-05: 50 mg via INTRAVENOUS

## 2011-06-05 MED ORDER — ONDANSETRON HCL 4 MG PO TABS: 4.0000 mg | ORAL_TABLET | Freq: Four times a day (QID) | ORAL | Status: DC | PRN

## 2011-06-05 MED ORDER — HYDROMORPHONE HCL PF 1 MG/ML IJ SOLN: 0.2500 mg | INTRAMUSCULAR | Status: DC | PRN

## 2011-06-05 MED ORDER — MAGNESIUM HYDROXIDE 400 MG/5ML PO SUSP: 30.0000 mL | Freq: Two times a day (BID) | ORAL | Status: DC | PRN

## 2011-06-05 MED ORDER — PHENOL 1.4 % MT LIQD
1.0000 | OROMUCOSAL | Status: DC | PRN
  Filled 2011-06-05: qty 177

## 2011-06-05 MED ORDER — CEFAZOLIN SODIUM-DEXTROSE 2-3 GM-% IV SOLR
2.0000 g | Freq: Four times a day (QID) | INTRAVENOUS | Status: AC
  Administered 2011-06-05 – 2011-06-06 (×2): 2 g via INTRAVENOUS
  Filled 2011-06-05 (×2): qty 50

## 2011-06-05 MED ORDER — SODIUM CHLORIDE 0.9 % IR SOLN
Status: DC | PRN
  Administered 2011-06-05: 3000 mL

## 2011-06-05 MED ORDER — PROPOFOL 10 MG/ML IV EMUL
INTRAVENOUS | Status: DC | PRN
  Administered 2011-06-05: 180 mg via INTRAVENOUS
  Administered 2011-06-05: 1 mg via INTRAVENOUS

## 2011-06-05 MED ORDER — BISACODYL 5 MG PO TBEC: 10.0000 mg | DELAYED_RELEASE_TABLET | Freq: Every day | ORAL | Status: DC | PRN

## 2011-06-05 MED ORDER — MENTHOL 3 MG MT LOZG: 1.0000 | LOZENGE | OROMUCOSAL | Status: DC | PRN

## 2011-06-05 SURGICAL SUPPLY — 53 items
BIT DRILL TAK (DRILL) IMPLANT
BLADE CUDA 5.5 (BLADE) IMPLANT
BLADE GREAT WHITE 4.2 (BLADE) ×3 IMPLANT
BLADE LONG MED 31X9 (MISCELLANEOUS) IMPLANT
BLADE SURG 11 STRL SS (BLADE) ×3 IMPLANT
BUR OVAL 6.0 (BURR) ×3 IMPLANT
CANNULA SHOULDER 7CM (CANNULA) ×3 IMPLANT
CLOTH BEACON ORANGE TIMEOUT ST (SAFETY) ×3 IMPLANT
DRAPE STERI 35X30 U-POUCH (DRAPES) ×3 IMPLANT
DRAPE SURG 17X23 STRL (DRAPES) IMPLANT
DRAPE U-SHAPE 47X51 STRL (DRAPES) IMPLANT
DRILL TAK (DRILL)
DRSG ADAPTIC 3X8 NADH LF (GAUZE/BANDAGES/DRESSINGS) ×3 IMPLANT
DRSG PAD ABDOMINAL 8X10 ST (GAUZE/BANDAGES/DRESSINGS) ×3 IMPLANT
DURAPREP 26ML APPLICATOR (WOUND CARE) ×3 IMPLANT
GLOVE BIOGEL PI IND STRL 8 (GLOVE) ×4 IMPLANT
GLOVE BIOGEL PI INDICATOR 8 (GLOVE) ×2
GLOVE ORTHO TXT STRL SZ7.5 (GLOVE) ×6 IMPLANT
GLOVE SURG ORTHO 8.0 STRL STRW (GLOVE) ×9 IMPLANT
GOWN PREVENTION PLUS XLARGE (GOWN DISPOSABLE) ×6 IMPLANT
GOWN STRL NON-REIN LRG LVL3 (GOWN DISPOSABLE) ×3 IMPLANT
IV NS IRRIG 3000ML ARTHROMATIC (IV SOLUTION) ×12 IMPLANT
KIT BASIN OR (CUSTOM PROCEDURE TRAY) ×3 IMPLANT
KIT ROOM TURNOVER OR (KITS) ×3 IMPLANT
MANIFOLD NEPTUNE II (INSTRUMENTS) ×3 IMPLANT
NEEDLE 22X1 1/2 (OR ONLY) (NEEDLE) ×3 IMPLANT
NEEDLE SPNL 18GX3.5 QUINCKE PK (NEEDLE) ×3 IMPLANT
NS IRRIG 1000ML POUR BTL (IV SOLUTION) ×3 IMPLANT
PACK SHOULDER (CUSTOM PROCEDURE TRAY) ×3 IMPLANT
SET ARTHROSCOPY TUBING (MISCELLANEOUS) ×2
SET ARTHROSCOPY TUBING LN (MISCELLANEOUS) ×4 IMPLANT
SLING ARM IMMOBILIZER XL (CAST SUPPLIES) ×3 IMPLANT
SPEAR FASTAKII (SLEEVE) IMPLANT
SPONGE GAUZE 4X4 12PLY (GAUZE/BANDAGES/DRESSINGS) ×6 IMPLANT
SPONGE GAUZE 4X4 FOR O.R. (GAUZE/BANDAGES/DRESSINGS) ×3 IMPLANT
SPONGE LAP 4X18 X RAY DECT (DISPOSABLE) ×6 IMPLANT
SUT ETHIBOND NAB CT1 #1 30IN (SUTURE) IMPLANT
SUT ETHILON 3 0 PS 1 (SUTURE) ×3 IMPLANT
SUT ETHILON 4 0 PS 2 18 (SUTURE) ×3 IMPLANT
SUT FIBERWIRE #2 38 T-5 BLUE (SUTURE)
SUT MNCRL AB 3-0 PS2 18 (SUTURE) ×3 IMPLANT
SUT VIC AB 0 CT1 27 (SUTURE)
SUT VIC AB 0 CT1 27XBRD ANBCTR (SUTURE) IMPLANT
SUT VIC AB 2-0 CT1 27 (SUTURE)
SUT VIC AB 2-0 CT1 TAPERPNT 27 (SUTURE) IMPLANT
SUTURE FIBERWR #2 38 T-5 BLUE (SUTURE) IMPLANT
SYR 20ML ECCENTRIC (SYRINGE) IMPLANT
SYR CONTROL 10ML LL (SYRINGE) ×3 IMPLANT
TAPE CLOTH SURG 6X10 WHT LF (GAUZE/BANDAGES/DRESSINGS) ×3 IMPLANT
TOWEL OR 17X24 6PK STRL BLUE (TOWEL DISPOSABLE) ×3 IMPLANT
TOWEL OR 17X26 10 PK STRL BLUE (TOWEL DISPOSABLE) ×3 IMPLANT
WAND 90 DEG TURBOVAC W/CORD (SURGICAL WAND) ×6 IMPLANT
WATER STERILE IRR 1000ML POUR (IV SOLUTION) ×3 IMPLANT

## 2011-06-05 NOTE — Transfer of Care (Signed)
Immediate Anesthesia Transfer of Care Note  Patient: Abigail Townsend  Procedure(s) Performed:  SHOULDER ARTHROSCOPY WITH SUBACROMIAL DECOMPRESSION - shoulder arthroscopy with labral debridment and subacromial decomproession  Patient Location: PACU  Anesthesia Type: General and Regional  Level of Consciousness: awake and patient cooperative  Airway & Oxygen Therapy: Patient Spontanous Breathing and Patient connected to nasal cannula oxygen  Post-op Assessment: Report given to PACU RN, Post -op Vital signs reviewed and stable, Patient moving all extremities and Patient moving all extremities X 4  Post vital signs: Reviewed and stable  Complications: No apparent anesthesia complicationsNo apparent anesthesia complications

## 2011-06-05 NOTE — Anesthesia Procedure Notes (Signed)
Anesthesia Regional Block:  Interscalene brachial plexus block  Pre-Anesthetic Checklist: ,, timeout performed, Correct Patient, Correct Site, Correct Laterality, Correct Procedure, Correct Position, site marked, Risks and benefits discussed,  Surgical consent,  Pre-op evaluation,  At surgeon's request and post-op pain management  Laterality: Left  Prep: chloraprep       Needles:  Injection technique: Single-shot  Needle Type: Echogenic Stimulator Needle     Needle Length: 5cm 5 cm Needle Gauge: 22 and 22 G    Additional Needles:  Procedures: ultrasound guided and nerve stimulator Interscalene brachial plexus block  Nerve Stimulator or Paresthesia:  Response: biceps flexion, 0.45 mA,   Additional Responses:   Narrative:  Start time: 06/05/2011 10:30 AM End time: 06/05/2011 10:43 AM Injection made incrementally with aspirations every 5 mL.  Performed by: Personally  Anesthesiologist: Dr Chaney Malling  Additional Notes: Functioning IV was confirmed and monitors were applied.  A 50mm 22ga Arrow echogenic stimulator needle was used. Sterile prep and drape,hand hygiene and sterile gloves were used.  Negative aspiration and negative test dose prior to incremental administration of local anesthetic. The patient tolerated the procedure well.  Ultrasound guidance: relevent anatomy identified, needle position confirmed, local anesthetic spread visualized around nerve(s), vascular puncture avoided.  Image printed for medical record.   Interscalene brachial plexus block

## 2011-06-05 NOTE — Anesthesia Preprocedure Evaluation (Addendum)
Anesthesia Evaluation  Patient identified by MRN, date of birth, ID band Patient awake    Reviewed: Allergy & Precautions, H&P , NPO status , Patient's Chart, lab work & pertinent test results  Airway Mallampati: II  Neck ROM: full    Dental   Pulmonary asthma , sleep apnea ,          Cardiovascular hypertension, + angina + CAD + Valvular Problems/Murmurs     Neuro/Psych  Headaches,    GI/Hepatic GERD-  ,  Endo/Other    Renal/GU      Musculoskeletal   Abdominal   Peds  Hematology   Anesthesia Other Findings   Reproductive/Obstetrics                           Anesthesia Physical Anesthesia Plan  ASA: III  Anesthesia Plan: General   Post-op Pain Management: MAC Combined w/ Regional for Post-op pain   Induction: Intravenous  Airway Management Planned: Oral ETT  Additional Equipment:   Intra-op Plan:   Post-operative Plan: Extubation in OR  Informed Consent: I have reviewed the patients History and Physical, chart, labs and discussed the procedure including the risks, benefits and alternatives for the proposed anesthesia with the patient or authorized representative who has indicated his/her understanding and acceptance.   Dental advisory given  Plan Discussed with: CRNA, Surgeon and Anesthesiologist  Anesthesia Plan Comments:        Anesthesia Quick Evaluation

## 2011-06-05 NOTE — Brief Op Note (Signed)
06/05/2011  1:18 PM  PATIENT:  Abigail Townsend  55 y.o. female  PRE-OPERATIVE DIAGNOSIS:  left shoulder subacromial bursitis, symptomatic os acromiale   POST-OPERATIVE DIAGNOSIS:  left shoulder subacromial bursitis, symptomatic os acromiale   PROCEDURE:  Procedure(s): SHOULDER ARTHROSCOPY WITH SUBACROMIAL DECOMPRESSION  SURGEON:  Surgeon(s): Thera Flake., MD  PHYSICIAN ASSISTANT: Margart Sickles, PA-C   ANESTHESIA:   general  EBL:  Total I/O In: 1000 [I.V.:1000] Out: -   BLOOD ADMINISTERED:none  DRAINS: none   LOCAL MEDICATIONS USED:  NONE  SPECIMEN:  No Specimen  DISPOSITION OF SPECIMEN:  N/A  COUNTS:  YES  TOURNIQUET:  * No tourniquets in log *  DICTATION: .Note written in EPIC  PLAN OF CARE: Admit for overnight observation  PATIENT DISPOSITION:  PACU - hemodynamically stable.   First dose of ASA 162mg  tonight

## 2011-06-05 NOTE — Op Note (Signed)
Abigail Townsend, Abigail Townsend NO.:  [id number]  MEDICAL RECORD NO.:  [id number]  LOCATION:  5034                         FACILITY:  [facility]  PHYSICIAN:  Dyke Brackett, M.D.    DATE OF BIRTH:  11/29/1955  DATE OF PROCEDURE:  06/05/2011 DATE OF DISCHARGE:                              OPERATIVE REPORT   PREOPERATIVE DIAGNOSIS:  Impingement with labral tear.  POSTOPERATIVE DIAGNOSIS:  Impingement with labral tear.  OPERATION: 1. Arthroscopic debridement of torn labrum. 2. Arthroscopic acromioplasty, all for the left shoulder.  DESCRIPTION OF PROCEDURE:  Sterile prep and drape, beach-chair positioning, general anesthetic with the nerve block, posterior approach to the shoulder blade and pierced portal laterally and anterior portal. She intra-articularly had nonspecific synovitis, debrided.  We gently entered the anterior superior labrum that was well debrided.  The undersurface of the cuff was normal.  The bursa was extremely inflamed, hypertrophied, and we resected the bursa.  We performed an acromioplasty.  It was noted that she had a rather large os acromiale preoperatively and we could visualize this.  It seemed to project anterior and inferior.  Obviously, we did not want to do anything to destabilize that.  We performed an acromioplasty and actually resected the most prominent anterior-inferior part of the os as well, which seemed to be impinging and this opened up the subacromial space additionally.  AC joint was not violated.  Bursectomy carried out. Shoulder drained free of fluid, porous-coated nylon, taken to recovery room in stable condition with sling and sterile dressings.     Dyke Brackett, M.D.     WDC/MEDQ  D:  06/05/2011  T:  06/05/2011  Job:  [id number]

## 2011-06-05 NOTE — Interval H&P Note (Signed)
History and Physical Interval Note:   06/05/2011   11:24 AM   Abigail Townsend  has presented today for surgery, with the diagnosis of left shoulder subacromial bursitis, symptomatic os acromiale   The various methods of treatment have been discussed with the patient and family. After consideration of risks, benefits and other options for treatment, the patient has consented to  Procedure(s): SHOULDER ARTHROSCOPY WITH OPEN ROTATOR CUFF REPAIR AND DISTAL CLAVICLE ACROMINECTOMY as a surgical intervention .  The patients' history has been reviewed, patient examined, no change in status, stable for surgery.  I have reviewed the patients' chart and labs.  Questions were answered to the patient's satisfaction.     Markes Shatswell JR,W D  MD

## 2011-06-05 NOTE — Anesthesia Postprocedure Evaluation (Signed)
Anesthesia Post Note  Patient: Abigail Townsend  Procedure(s) Performed:  SHOULDER ARTHROSCOPY WITH SUBACROMIAL DECOMPRESSION - shoulder arthroscopy with labral debridment and subacromial decomproession  Anesthesia type: General  Patient location: PACU  Post pain: Pain level controlled and Adequate analgesia  Post assessment: Post-op Vital signs reviewed, Patient's Cardiovascular Status Stable, Respiratory Function Stable, Patent Airway and Pain level controlled  Last Vitals:  Filed Vitals:   06/05/11 1315  BP: 108/61  Pulse:   Temp: 36.4 C  Resp:     Post vital signs: Reviewed and stable  Level of consciousness: awake, alert  and oriented  Complications: No apparent anesthesia complications

## 2011-06-05 NOTE — H&P (Signed)
Abigail Townsend is an 55 y.o. female.   Chief Complaint: Left Shoulder Pain  HPI: Patient is a 55yo female with multiple medical comorbities with history of left shoulder pain.  She has failed conservative treatment with oral medications, injection, MRI revealed tendinopathy, impingement, and os acrominale.  She has stopped her blood thinners Effient and ASA, started back on ASA 81mg  x2 last night and this am.  Her cardiologist was contacted and discussed risks and benefits of procedure and she was cleared for surgery.  Past Medical History  Diagnosis Date  . Angina   . Hypertension   . Heart murmur     since birth   . Asthma   . Sleep apnea     study done oct 2012 does not have cpap   . Apnea, sleep     dr Perley Jain [phone]  . GERD (gastroesophageal reflux disease)   . Headache   . Arthritis     osteo rt knee     Past Surgical History  Procedure Date  . Cardiac catheterization     10/2010 12/2010  . Coronary angioplasty with stent placement     10/2010  . Joint replacement     2009 rt knee   . Patella realignment 2010    rt knee   . Breast reduction surgery 1996     bilat   . Breast surgery 1997    suture repair rt breaast   . Abdominal hysterectomy     1994    only uterus  . Cholecystectomy     1981  . Breast lumpectomy 6/89  11/2003     right breast   benign   . Bladder repair 2007    bladder sling   . Laparoscopy 1987    History reviewed. No pertinent family history. Social History:  reports that she has quit smoking. She does not have any smokeless tobacco history on file. She reports that she drinks alcohol. She reports that she does not use illicit drugs.  Allergies:  Allergies  Allergen Reactions  . Erythromycin Nausea And Vomiting  . Other     Green peppers    Medications Prior to Admission  Medication Dose Route Frequency Provider Last Rate Last Dose  . fentaNYL (SUBLIMAZE) injection 50-100 mcg  50-100 mcg Intravenous PRN Raiford Simmonds, MD   50 mcg  at 06/05/11 1031  . lactated ringers infusion   Intravenous Continuous Raiford Simmonds, MD 50 mL/hr at 06/05/11 1024    . midazolam (VERSED) injection 1-2 mg  1-2 mg Intravenous PRN Raiford Simmonds, MD   1 mg at 06/05/11 1030   No current outpatient prescriptions on file as of 06/05/2011.    Results for orders placed during the hospital encounter of 06/03/11 (from the past 48 hour(s))  SURGICAL PCR SCREEN     Status: Abnormal   Collection Time   06/03/11 12:48 PM      Component Value Range Comment   MRSA, PCR NEGATIVE  NEGATIVE     Staphylococcus aureus POSITIVE (*) NEGATIVE    BASIC METABOLIC PANEL     Status: Abnormal   Collection Time   06/03/11 12:49 PM      Component Value Range Comment   Sodium 142  135 - 145 (mEq/L)    Potassium 3.9  3.5 - 5.1 (mEq/L)    Chloride 105  96 - 112 (mEq/L)    CO2 27  19 - 32 (mEq/L)    Glucose, Bld 148 (*) 70 -  99 (mg/dL)    BUN 22  6 - 23 (mg/dL)    Creatinine, Ser 1.61  0.50 - 1.10 (mg/dL)    Calcium 9.7  8.4 - 10.5 (mg/dL)    GFR calc non Af Amer 81 (*) >90 (mL/min)    GFR calc Af Amer >90  >90 (mL/min)   CBC     Status: Normal   Collection Time   06/03/11 12:49 PM      Component Value Range Comment   WBC 9.4  4.0 - 10.5 (K/uL)    RBC 4.80  3.87 - 5.11 (MIL/uL)    Hemoglobin 12.9  12.0 - 15.0 (g/dL)    HCT 09.6  04.5 - 40.9 (%)    MCV 82.7  78.0 - 100.0 (fL)    MCH 26.9  26.0 - 34.0 (pg)    MCHC 32.5  30.0 - 36.0 (g/dL)    RDW 81.1  91.4 - 78.2 (%)    Platelets 165  150 - 400 (K/uL)    Dg Chest 2 View  06/05/2011  *RADIOLOGY REPORT*  Clinical Data: Preoperative respiratory evaluation prior to left rotator cuff surgery.  CHEST - 2 VIEW 06/05/2011:  Comparison: Two-view chest x-ray 07/10/2009 and 05/14/2008 [facility].  Findings: Cardiac silhouette upper normal in size, unchanged. Thoracic aorta mildly atherosclerotic, unchanged.  Hilar and mediastinal contours otherwise unremarkable.  Lungs clear. Bronchovascular markings  normal.  No pleural effusions.  Mild degenerative changes involving the thoracic spine.  No significant interval change.  IMPRESSION: No acute cardiopulmonary disease.  Stable examination.  Original Report Authenticated By: Arnell Sieving, M.D.    ROS A 10 point review of systems was reviewed and noncontribuitory.  Blood pressure 105/64, pulse 92, temperature 97.8 F (36.6 C), temperature source Oral, resp. rate 22, SpO2 96.00%. Physical Exam  Constitutional: She appears well-developed and well-nourished.  HENT:  Head: Normocephalic.  Eyes: Conjunctivae are normal. Pupils are equal, round, and reactive to light.  Neck: Normal range of motion. Neck supple.  Cardiovascular: Normal rate, regular rhythm, normal heart sounds and intact distal pulses.   Respiratory: Effort normal and breath sounds normal.  GI: Soft. Bowel sounds are normal.  Musculoskeletal: She exhibits tenderness.       Left shoulder: She exhibits decreased range of motion, crepitus, pain and decreased strength.     Assessment/Plan Left shoulder impingement with os acrominale.  Risks and benefits of arthroscopic decompression and debridement were discussed and pt wishes to proceed.  She will obtain preop antibiotics.  We are planning on discharge home following surgery if stable.  Follow up in 5-6 days with Dr. Madelon Lips.   Margart Sickles 06/05/2011, 11:05 AM

## 2011-06-05 NOTE — Op Note (Signed)
dictated

## 2011-06-06 NOTE — Progress Notes (Signed)
Occupational Therapy Shoulder Evaluation Evaluation completed and filed in ghost chart.  Pt did very well! Will not follow acutely as all education has been completed and patient will have necessary level of assistance upon d/c home with husband.  No equipment needs. Recommend progress rehab of shoulder as ordered by MD post follow-up appointment.   06/06/2011 Abigail Townsend OTR/L Pager [phone] Office [phone]

## 2011-06-13 NOTE — Discharge Summary (Signed)
PATIENT ID:      Abigail Townsend  MRN:     [id number] DOB/AGE:    03-13-1956 / 55 y.o.     DISCHARGE SUMMARY  ADMISSION DATE:    06/05/2011 DISCHARGE DATE:   06/06/11   ADMISSION DIAGNOSIS: left shoulder subacromial bursitis, symptomatic os acromiale   DISCHARGE DIAGNOSIS:  left shoulder subacromial bursitis, symptomatic os acromiale     ADDITIONAL DIAGNOSIS: Principal Problem:  *Impingement syndrome of left shoulder  Past Medical History  Diagnosis Date  . Angina   . Hypertension   . Heart murmur     since birth   . Asthma   . Sleep apnea     study done oct 2012 does not have cpap   . Apnea, sleep     dr Perley Jain [phone]  . GERD (gastroesophageal reflux disease)   . Headache   . Arthritis     osteo rt knee     PROCEDURE: Procedure(s): SHOULDER ARTHROSCOPY WITH SUBACROMIAL DECOMPRESSION on 06/05/2011  CONSULTS:   Respiratory Therapy for sleep apnea, CPAP   HISTORY:  See H&P in chart  HOSPITAL COURSE:  Abigail Townsend is a 55 y.o. admitted on 06/05/2011 and found to have a diagnosis of left shoulder subacromial bursitis, symptomatic os acromiale .  After appropriate laboratory studies were obtained  they were taken to the operating room on 06/05/2011 and underwent Procedure(s): SHOULDER ARTHROSCOPY WITH SUBACROMIAL DECOMPRESSION.   They were given perioperative antibiotics:  Anti-infectives     Start     Dose/Rate Route Frequency Ordered Stop   06/05/11 1900   ceFAZolin (ANCEF) IVPB 2 g/50 mL premix        2 g 100 mL/hr over 30 Minutes Intravenous Every 6 hours 06/05/11 1756 06/06/11 0155        .    The remainder of the hospital course was dedicated to pain control and observation for sleep apnea.   The patient was discharged on post op day 1 in stable condition.   DIAGNOSTIC STUDIES: Recent vital signs:      Recent laboratory studies: No results found for this basename: WBC:7,HGB:7,HCT:7,PLT:7 in the last 168 hours No results found for this basename:  NA:7,K:7,CL:7,CO2:7,BUN:7,CREATININE:7,GLUCOSE:7,CALCIUM:7 in the last 168 hours Lab Results  Component Value Date   INR 0.93 07/10/2009   INR 0.9 05/14/2008     Recent Radiographic Studies :  Dg Chest 2 View  06/05/2011  *RADIOLOGY REPORT*  Clinical Data: Preoperative respiratory evaluation prior to left rotator cuff surgery.  CHEST - 2 VIEW 06/05/2011:  Comparison: Two-view chest x-ray 07/10/2009 and 05/14/2008 [facility].  Findings: Cardiac silhouette upper normal in size, unchanged. Thoracic aorta mildly atherosclerotic, unchanged.  Hilar and mediastinal contours otherwise unremarkable.  Lungs clear. Bronchovascular markings normal.  No pleural effusions.  Mild degenerative changes involving the thoracic spine.  No significant interval change.  IMPRESSION: No acute cardiopulmonary disease.  Stable examination.  Original Report Authenticated By: Arnell Sieving, M.D.    DISCHARGE INSTRUCTIONS: Discharge Orders    Future Orders Please Complete By Expires   Diet - low sodium heart healthy      Call MD / Call 911      Comments:   If you experience chest pain or shortness of breath, CALL 911 and be transported to the hospital emergency room.  If you develope a fever above 101 F, pus (white drainage) or increased drainage or redness at the wound, or calf pain, call your surgeon's office.   Constipation Prevention  Comments:   Drink plenty of fluids.  Prune juice may be helpful.  You may use a stool softener, such as Colace (over the counter) 100 mg twice a day.  Use MiraLax (over the counter) for constipation as needed.   Increase activity slowly as tolerated      Weight Bearing as taught in Physical Therapy      Comments:   Use a walker or crutches as instructed.   Discharge instructions      Comments:   Start pendulum exercises on 06/10/11 as shown by the therapist. You may come out of sling to shower in 24-36hrs wash over incisions soap and water, no soaking in  tubs/pools.       DISCHARGE MEDICATIONS:   Discharge Medication List as of 06/06/2011 10:09 AM    START taking these medications   Details  methocarbamol (ROBAXIN) 500 MG tablet Take 1 tablet (500 mg total) by mouth 4 (four) times daily., Starting 06/05/2011, Until Mon 06/15/11, Print    oxyCODONE-acetaminophen (ROXICET) 5-325 MG per tablet Take 1-2 tabs po q4-6hrs prn pain, Print      CONTINUE these medications which have NOT CHANGED   Details  aspirin 81 MG tablet Take 81 mg by mouth daily.  , Until Discontinued, Historical Med    enalapril (VASOTEC) 5 MG tablet Take 5 mg by mouth daily.  , Until Discontinued, Historical Med    estradiol (CLIMARA - DOSED IN MG/24 HR) 0.025 mg/24hr Place 1 patch onto the skin once a week.  , Until Discontinued, Historical Med    loratadine (CLARITIN) 10 MG tablet Take 10 mg by mouth daily.  , Until Discontinued, Historical Med    metoprolol tartrate (LOPRESSOR) 25 MG tablet Take 25 mg by mouth daily.  , Until Discontinued, Historical Med    pregabalin (LYRICA) 150 MG capsule Take 150 mg by mouth 2 (two) times daily. TAKING 150 MG IN THE AM AND 300 MG AT BEDTIME  , Until Discontinued, Historical Med    rosuvastatin (CRESTOR) 20 MG tablet Take 20 mg by mouth at bedtime.  , Until Discontinued, Historical Med    topiramate (TOPAMAX) 200 MG tablet Take 200 mg by mouth 2 (two) times daily.  , Until Discontinued, Historical Med    triamterene-hydrochlorothiazide (MAXZIDE-25) 37.5-25 MG per tablet Take 1 tablet by mouth daily.  , Until Discontinued, Historical Med        FOLLOW UP VISIT:   Follow-up Information    Follow up with CAFFREY JR,W D, MD on 06/08/2011.   Contact information:   [facility]y [facility], [facility] [address] [place] [address] [phone]          DISPOSITION:  Home  Home or Self Care  CONDITION:  Stable   Margart Sickles 06/13/2011, 6:02 PM

## 2012-12-28 ENCOUNTER — Encounter (INDEPENDENT_AMBULATORY_CARE_PROVIDER_SITE_OTHER): Payer: BC Managed Care – PPO

## 2012-12-28 DIAGNOSIS — M79609 Pain in unspecified limb: Secondary | ICD-10-CM

## 2012-12-28 DIAGNOSIS — R609 Edema, unspecified: Secondary | ICD-10-CM

## 2012-12-28 DIAGNOSIS — R52 Pain, unspecified: Secondary | ICD-10-CM

## 2013-04-21 ENCOUNTER — Other Ambulatory Visit: Payer: Self-pay | Admitting: Physician Assistant

## 2013-04-21 ENCOUNTER — Encounter (HOSPITAL_COMMUNITY): Payer: Self-pay | Admitting: Pharmacy Technician

## 2013-04-25 NOTE — Pre-Procedure Instructions (Signed)
Abigail Townsend  04/25/2013   Your procedure is scheduled on:  Fri, Oct 17 @ 7:30 AM  Report to [facility] Short Stay Entrance A @ 5:30 AM.  Call this number if you have problems the morning of surgery: [phone]   Remember:   Do not eat food or drink liquids after midnight.   Take these medicines the morning of surgery with A SIP OF WATER: Albuterol<Bring Your Inhaler With You>,Symbicort,Metoprolol(Toprol),SIngulair(Montelukast),Pantoprazole(Protonix),and Lyrica(Pregabalin)              Stop taking your Fish Oil and Effient.No Goody's,BC's,Aleve,Ibuprofen,or any Herbal Medications   Do not wear jewelry, make-up or nail polish.  Do not wear lotions, powders, or perfumes. You may wear deodorant.  Do not shave 48 hours prior to surgery.   Do not bring valuables to the hospital.  [facility] is not responsible                  for any belongings or valuables.               Contacts, dentures or bridgework may not be worn into surgery.  Leave suitcase in the car. After surgery it may be brought to your room.  For patients admitted to the hospital, discharge time is determined by your                treatment team.                 Special Instructions: Shower using CHG 2 nights before surgery and the night before surgery.  If you shower the day of surgery use CHG.  Use special wash - you have one bottle of CHG for all showers.  You should use approximately 1/3 of the bottle for each shower.   Please read over the following fact sheets that you were given: Pain Booklet, Coughing and Deep Breathing, Blood Transfusion Information, Total Joint Packet, MRSA Information and Surgical Site Infection Prevention

## 2013-04-26 ENCOUNTER — Encounter (HOSPITAL_COMMUNITY): Payer: Self-pay

## 2013-04-26 ENCOUNTER — Encounter (HOSPITAL_COMMUNITY)
Admission: RE | Admit: 2013-04-26 | Discharge: 2013-04-26 | Disposition: A | Discharge status: Home / Self Care | Payer: BC Managed Care – PPO | Source: Ambulatory Visit | Attending: Orthopedic Surgery | Admitting: Orthopedic Surgery

## 2013-04-26 DIAGNOSIS — Z01812 Encounter for preprocedural laboratory examination: Secondary | ICD-10-CM | POA: Insufficient documentation

## 2013-04-26 HISTORY — DX: Other specified postprocedural states: Z98.890

## 2013-04-26 HISTORY — DX: Hyperlipidemia, unspecified: E78.5

## 2013-04-26 HISTORY — DX: Personal history of urinary calculi: Z87.442

## 2013-04-26 HISTORY — DX: Anemia, unspecified: D64.9

## 2013-04-26 HISTORY — DX: Constipation, unspecified: K59.00

## 2013-04-26 HISTORY — DX: Other specified postprocedural states: R11.2

## 2013-04-26 HISTORY — DX: Spontaneous ecchymoses: R23.3

## 2013-04-26 HISTORY — DX: Effusion, unspecified joint: M25.40

## 2013-04-26 HISTORY — DX: Insomnia, unspecified: G47.00

## 2013-04-26 HISTORY — DX: Atherosclerotic heart disease of native coronary artery without angina pectoris: I25.10

## 2013-04-26 HISTORY — DX: Other skin changes: R23.8

## 2013-04-26 HISTORY — DX: Irritable bowel syndrome, unspecified: K58.9

## 2013-04-26 HISTORY — DX: Pain in unspecified joint: M25.50

## 2013-04-26 HISTORY — DX: Type 2 diabetes mellitus without complications: E11.9

## 2013-04-26 LAB — COMPREHENSIVE METABOLIC PANEL
ALT: 27 U/L (ref 0–35)
AST: 21 U/L (ref 0–37)
Albumin: 3.6 g/dL (ref 3.5–5.2)
Alkaline Phosphatase: 81 U/L (ref 39–117)
BUN: 13 mg/dL (ref 6–23)
CO2: 27 mEq/L (ref 19–32)
Chloride: 100 mEq/L (ref 96–112)
Potassium: 3.6 mEq/L (ref 3.5–5.1)
Sodium: 140 mEq/L (ref 135–145)
Total Bilirubin: 0.5 mg/dL (ref 0.3–1.2)
Total Protein: 7.5 g/dL (ref 6.0–8.3)

## 2013-04-26 LAB — CBC WITH DIFFERENTIAL/PLATELET
Basophils Absolute: 0.1 10*3/uL (ref 0.0–0.1)
Basophils Relative: 1 % (ref 0–1)
Eosinophils Absolute: 0.4 10*3/uL (ref 0.0–0.7)
Hemoglobin: 12.6 g/dL (ref 12.0–15.0)
MCH: 26.5 pg (ref 26.0–34.0)
MCHC: 33.3 g/dL (ref 30.0–36.0)
Monocytes Relative: 6 % (ref 3–12)
Neutro Abs: 6.2 10*3/uL (ref 1.7–7.7)
Neutrophils Relative %: 65 % (ref 43–77)
Platelets: 202 10*3/uL (ref 150–400)
WBC: 9.5 10*3/uL (ref 4.0–10.5)

## 2013-04-26 LAB — URINALYSIS, ROUTINE W REFLEX MICROSCOPIC
Hgb urine dipstick: NEGATIVE
Nitrite: NEGATIVE
Protein, ur: NEGATIVE mg/dL
Specific Gravity, Urine: 1.024 (ref 1.005–1.030)
Urobilinogen, UA: 1 mg/dL (ref 0.0–1.0)

## 2013-04-26 LAB — PROTIME-INR
INR: 0.93 (ref 0.00–1.49)
Prothrombin Time: 12.3 seconds (ref 11.6–15.2)

## 2013-04-26 LAB — SURGICAL PCR SCREEN
MRSA, PCR: NEGATIVE
Staphylococcus aureus: NEGATIVE

## 2013-04-26 LAB — APTT: aPTT: 28 seconds (ref 24–37)

## 2013-04-26 MED ORDER — CHLORHEXIDINE GLUCONATE 4 % EX LIQD: 60.0000 mL | Freq: Once | CUTANEOUS | Status: DC

## 2013-04-26 NOTE — Progress Notes (Addendum)
No angina or chest pain in past yr;Nitro last used a yr ago    Dr.Gary Miller in [place]-last visit in Sept 2014---[phone]-requested  Echo and stress test done in 2014 and to be requested from [facility]  EKG and CXR to be requested from [facility]

## 2013-04-27 LAB — URINE CULTURE

## 2013-04-27 NOTE — Progress Notes (Signed)
Spoke with Leia Alf at Dr. Candise Bowens office.  Marla to ask MD if CXR ordered by PA-C is needed.  Per Revonda Standard, PA-C, pt has had unremarkable chest CT scan within last year and from an anesthesia standpoint, pt does not need CXR.

## 2013-04-27 NOTE — Progress Notes (Signed)
Anesthesia Chart Review:  Patient is a 57 year old female scheduled for left TKA on 05/05/13 by Dr. Madelon Lips.  History includes CAD s/p DES distal CX and proximal LAD '12, moderate to severe OSA by 2012 sleep study, right TKA, hysterectomy, cholecystectomy. PCP is Dr. Carlota Raspberry at [facility] in [place]. Cardiologist is Dr. Daryel November in [place], [place]. He has cleared patient for this procedure.  EKG on 9/11/4 (Dr. Hyacinth Meeker) showed NSR, early transition with RSR prime in V1 or V2, non-specific inferior ST/T wave abnormality, anterior T wave abnormality, consider ischemia.    Cardiac cath (done for evaluation of stable angina) on 12/09/12 [facility]) showed LVEF 55-60%.  25% mid LAD, proximal LAD stent with no restenosis, 25% distal LAD.  Diagonals without significant disease.  Cx was left dominant with 25% ostial stenosis.  RCA was normal, but a small non-dominant vessel.  Continued medical therapy recommended.  Nuclear stress test on 01/28/12 (Dr. Hyacinth Meeker) showed inferior apical mild hypokinesia, no reversible ischemic perfusion defect, low risk scan.  Echo on 04/12[facility]) showed normal LV size, wall thickness, wall motion, and function.  EF 60-65%.  Mild diastolic dysfunction.  Trace MR/TR.  Chest CT with contrast [facility]) on 11/22/12 showed unremarkable visualized portions of the thyroid gland.  No aorta.  Suboptimal visualization of the peripheral pulmonary arteries, but no central PE or PE within the visualized vasculature. No significant mediastinal or axillary adenopathy.  Normal heart size.  Relatively extensive coronary artery calcification.  Lungs clear without infiltrate, effusion, or significant opacity. (Because she had an unremarkable chest CT this year, I don't think that she will necessarily need a preoperative CXR from an anesthesia standpoint. However, 2V CXR was ordered by Margart Sickles, PA-C, so I think it will be up to him or Dr. Madelon Lips if they would still like a 2V  CXR on the day of surgery.)  Preoperative labs noted.  If no acute changes then I would anticipate that she could proceed as planned.  Velna Ochs [facility] Short Stay Center/Anesthesiology Phone [phone] 04/27/2013 12:32 PM   .

## 2013-05-04 MED ORDER — SODIUM CHLORIDE 0.9 % IV SOLN: INTRAVENOUS | Status: DC

## 2013-05-04 MED ORDER — DEXTROSE 5 % IV SOLN
3.0000 g | INTRAVENOUS | Status: AC
  Administered 2013-05-05: 3 g via INTRAVENOUS
  Filled 2013-05-04 (×2): qty 3000

## 2013-05-04 NOTE — H&P (Signed)
TOTAL KNEE ADMISSION H&P  Patient is being admitted for left total knee arthroplasty.  Subjective:  Chief Complaint:left knee pain.  HPI: Abigail Townsend, 57 y.o. female, has a history of pain and functional disability in the left knee due to arthritis and has failed non-surgical conservative treatments for greater than 12 weeks to includeNSAID's and/or analgesics, corticosteriod injections, viscosupplementation injections, use of assistive devices and activity modification.  Onset of symptoms was gradual, starting 1 years ago with gradually worsening course since that time. The patient noted no past surgery on the left knee(s).  Patient currently rates pain in the left knee(s) at 9 out of 10 with activity. Patient has night pain, worsening of pain with activity and weight bearing, pain that interferes with activities of daily living, pain with passive range of motion and joint swelling.  Patient has evidence of periarticular osteophytes and joint space narrowing by imaging studies.  There is no active infection.  Patient Active Problem List   Diagnosis Date Noted  . Impingement syndrome of left shoulder 06/05/2011   Past Medical History  Diagnosis Date  . Heart murmur     since birth   . Arthritis     osteo rt knee   . Asthma     Albuterol Prn;takes Sindulair daily and uses Symbicort daily  . Insomnia     takes Elavil nightly and Ambien nightly  . GERD (gastroesophageal reflux disease)     takes Protonix daily  . Hypertension     takes Enalapril and Metoprolol daily as well as Maxzide   . Diabetes mellitus without complication     takes Metformin and Januvia daily  . PONV (postoperative nausea and vomiting)   . Hyperlipidemia     takes Crestor nightly  . Coronary artery disease   . Headache(784.0)     takes Maxalt daily prn and Lyrica daily  . Joint pain   . Joint swelling   . Bruises easily     d/t being on Effient and baby ASA--cardiologist told pt not to stop these  . IBS  (irritable bowel syndrome)     amitiza bid  . Constipation   . History of kidney stones     passed on her on many yrs ago  . Anemia     hx of  . Sleep apnea     sleep study done a yr ago-to request  . Apnea, sleep     dr Wysu [phone]    Past Surgical History  Procedure Laterality Date  . Joint replacement      2009 rt knee   . Patella realignment  2010    rt knee   . Breast reduction surgery  1996     bilat   . Breast surgery  1997    suture repair rt breaast   . Abdominal hysterectomy      1994    only uterus  . Cholecystectomy      1981  . Breast lumpectomy  6/89  11/2003     right breast   benign   . Bladder repair  2007    bladder sling   . Laparoscopy  1987  . Cardiac catheterization      10/2010 12/2010  . Coronary angioplasty with stent placement  2 stents    10/2010  . Left shoulder surgery    . Esophagogastroduodenoscopy       (Not in a hospital admission) Allergies  Allergen Reactions  . Erythromycin Nausea And Vomiting  .   Other     Green peppers causes stomach swelling and severe abdominal pain, nausea, vomiting and diarrhea    History  Substance Use Topics  . Smoking status: Former Smoker  . Smokeless tobacco: Not on file     Comment: quit in 1998  . Alcohol Use: Yes     Comment: rarely    No family history on file.   Review of Systems  Constitutional: Negative.   HENT: Negative for congestion, ear discharge, ear pain, hearing loss, nosebleeds, sore throat and tinnitus.   Eyes: Negative.   Respiratory: Positive for shortness of breath. Negative for cough, hemoptysis, sputum production, wheezing and stridor.   Cardiovascular: Positive for chest pain and leg swelling. Negative for palpitations, orthopnea, claudication and PND.  Gastrointestinal: Positive for nausea, vomiting and constipation. Negative for heartburn, abdominal pain, diarrhea, blood in stool and melena.  Genitourinary: Positive for frequency. Negative for dysuria, urgency,  hematuria and flank pain.  Musculoskeletal: Positive for joint pain.  Skin: Negative.   Neurological: Positive for headaches. Negative for dizziness, tingling, tremors, sensory change, speech change, focal weakness, seizures and loss of consciousness.  Endo/Heme/Allergies: Negative for environmental allergies and polydipsia. Bruises/bleeds easily.  Psychiatric/Behavioral: Negative.     Objective:  Physical Exam  Constitutional: She is oriented to person, place, and time. She appears well-developed and well-nourished. No distress.  HENT:  Head: Normocephalic and atraumatic.  Nose: Nose normal.  Eyes: Conjunctivae and EOM are normal. Pupils are equal, round, and reactive to light.  Neck: Normal range of motion. Neck supple.  Cardiovascular: Normal rate, regular rhythm and intact distal pulses.   Murmur heard. Respiratory: Effort normal and breath sounds normal. No respiratory distress. She has no wheezes. She has no rales. She exhibits no tenderness.  GI: Soft. Bowel sounds are normal. She exhibits no distension. There is no tenderness.  Musculoskeletal:       Left knee: She exhibits decreased range of motion, swelling and bony tenderness. She exhibits no effusion, no ecchymosis and no erythema. Tenderness found. Medial joint line and lateral joint line tenderness noted.  Lymphadenopathy:    She has no cervical adenopathy.  Neurological: She is alert and oriented to person, place, and time. No cranial nerve deficit.  Skin: Skin is warm and dry. No rash noted. No erythema.  Psychiatric: She has a normal mood and affect. Her behavior is normal.    Vital signs in last 24 hours: @VSRANGES@  Labs:   Estimated body mass index is 47.73 kg/(m^2) as calculated from the following:   Height as of 06/06/11: 5' 5" (1.651 m).   Weight as of 06/03/11: 130.1 kg (286 lb 13.1 oz).   Imaging Review Plain radiographs demonstrate severe degenerative joint disease of the left knee(s). The overall  alignment ismild varus. The bone quality appears to be good for age and reported activity level.  Assessment/Plan:  End stage arthritis, left knee   The patient history, physical examination, clinical judgment of the provider and imaging studies are consistent with end stage degenerative joint disease of the left knee(s) and total knee arthroplasty is deemed medically necessary. The treatment options including medical management, injection therapy arthroscopy and arthroplasty were discussed at length. The risks and benefits of total knee arthroplasty were presented and reviewed. The risks due to aseptic loosening, infection, stiffness, patella tracking problems, thromboembolic complications and other imponderables were discussed. The patient acknowledged the explanation, agreed to proceed with the plan and consent was signed. Patient is being admitted for inpatient treatment for   surgery, pain control, PT, OT, prophylactic antibiotics, VTE prophylaxis, progressive ambulation and ADL's and discharge planning. The patient is planning to be discharged home with home health services   

## 2013-05-05 ENCOUNTER — Encounter (HOSPITAL_COMMUNITY): Payer: BC Managed Care – PPO | Admitting: Vascular Surgery

## 2013-05-05 ENCOUNTER — Encounter (HOSPITAL_COMMUNITY): Payer: Self-pay | Admitting: *Deleted

## 2013-05-05 ENCOUNTER — Inpatient Hospital Stay (HOSPITAL_COMMUNITY): Payer: BC Managed Care – PPO | Admitting: Anesthesiology

## 2013-05-05 ENCOUNTER — Encounter (HOSPITAL_COMMUNITY)
Admission: RE | Disposition: A | Discharge status: Home Health | Payer: Self-pay | Source: Ambulatory Visit | Attending: Orthopedic Surgery

## 2013-05-05 ENCOUNTER — Inpatient Hospital Stay (HOSPITAL_COMMUNITY)
Admission: RE | Admit: 2013-05-05 | Discharge: 2013-05-08 | DRG: 470 | Disposition: A | Discharge status: Home Health | Payer: BC Managed Care – PPO | Source: Ambulatory Visit | Attending: Orthopedic Surgery | Admitting: Orthopedic Surgery

## 2013-05-05 DIAGNOSIS — G4730 Sleep apnea, unspecified: Secondary | ICD-10-CM | POA: Diagnosis present

## 2013-05-05 DIAGNOSIS — Z9861 Coronary angioplasty status: Secondary | ICD-10-CM

## 2013-05-05 DIAGNOSIS — M1712 Unilateral primary osteoarthritis, left knee: Secondary | ICD-10-CM | POA: Diagnosis present

## 2013-05-05 DIAGNOSIS — Z23 Encounter for immunization: Secondary | ICD-10-CM

## 2013-05-05 DIAGNOSIS — Z9089 Acquired absence of other organs: Secondary | ICD-10-CM

## 2013-05-05 DIAGNOSIS — I10 Essential (primary) hypertension: Secondary | ICD-10-CM | POA: Diagnosis present

## 2013-05-05 DIAGNOSIS — Z9889 Other specified postprocedural states: Secondary | ICD-10-CM | POA: Diagnosis present

## 2013-05-05 DIAGNOSIS — Z87891 Personal history of nicotine dependence: Secondary | ICD-10-CM

## 2013-05-05 DIAGNOSIS — Z96659 Presence of unspecified artificial knee joint: Secondary | ICD-10-CM

## 2013-05-05 DIAGNOSIS — Z7982 Long term (current) use of aspirin: Secondary | ICD-10-CM

## 2013-05-05 DIAGNOSIS — Z79899 Other long term (current) drug therapy: Secondary | ICD-10-CM

## 2013-05-05 DIAGNOSIS — R112 Nausea with vomiting, unspecified: Secondary | ICD-10-CM | POA: Diagnosis present

## 2013-05-05 DIAGNOSIS — Z7901 Long term (current) use of anticoagulants: Secondary | ICD-10-CM

## 2013-05-05 DIAGNOSIS — E119 Type 2 diabetes mellitus without complications: Secondary | ICD-10-CM | POA: Diagnosis present

## 2013-05-05 DIAGNOSIS — J45909 Unspecified asthma, uncomplicated: Secondary | ICD-10-CM | POA: Diagnosis present

## 2013-05-05 DIAGNOSIS — M7542 Impingement syndrome of left shoulder: Secondary | ICD-10-CM | POA: Diagnosis present

## 2013-05-05 DIAGNOSIS — K219 Gastro-esophageal reflux disease without esophagitis: Secondary | ICD-10-CM | POA: Diagnosis present

## 2013-05-05 DIAGNOSIS — Z881 Allergy status to other antibiotic agents status: Secondary | ICD-10-CM

## 2013-05-05 DIAGNOSIS — M25819 Other specified joint disorders, unspecified shoulder: Secondary | ICD-10-CM | POA: Diagnosis present

## 2013-05-05 DIAGNOSIS — M1990 Unspecified osteoarthritis, unspecified site: Secondary | ICD-10-CM | POA: Diagnosis present

## 2013-05-05 DIAGNOSIS — M1710 Unilateral primary osteoarthritis, unspecified knee: Principal | ICD-10-CM | POA: Diagnosis present

## 2013-05-05 DIAGNOSIS — K589 Irritable bowel syndrome without diarrhea: Secondary | ICD-10-CM | POA: Diagnosis present

## 2013-05-05 DIAGNOSIS — D649 Anemia, unspecified: Secondary | ICD-10-CM | POA: Diagnosis present

## 2013-05-05 DIAGNOSIS — Z6841 Body Mass Index (BMI) 40.0 and over, adult: Secondary | ICD-10-CM

## 2013-05-05 DIAGNOSIS — E785 Hyperlipidemia, unspecified: Secondary | ICD-10-CM | POA: Diagnosis present

## 2013-05-05 DIAGNOSIS — G4700 Insomnia, unspecified: Secondary | ICD-10-CM | POA: Diagnosis present

## 2013-05-05 DIAGNOSIS — I2510 Atherosclerotic heart disease of native coronary artery without angina pectoris: Secondary | ICD-10-CM | POA: Diagnosis present

## 2013-05-05 DIAGNOSIS — Z87442 Personal history of urinary calculi: Secondary | ICD-10-CM

## 2013-05-05 HISTORY — PX: TOTAL KNEE ARTHROPLASTY: SHX125

## 2013-05-05 HISTORY — DX: Presence of unspecified artificial knee joint: Z96.659

## 2013-05-05 HISTORY — DX: Unilateral primary osteoarthritis, left knee: M17.12

## 2013-05-05 LAB — CBC
HCT: 31 % — ABNORMAL LOW (ref 36.0–46.0)
Hemoglobin: 10.2 g/dL — ABNORMAL LOW (ref 12.0–15.0)
MCH: 26.3 pg (ref 26.0–34.0)
MCHC: 32.9 g/dL (ref 30.0–36.0)
MCV: 79.9 fL (ref 78.0–100.0)

## 2013-05-05 LAB — GLUCOSE, CAPILLARY
Glucose-Capillary: 115 mg/dL — ABNORMAL HIGH (ref 70–99)
Glucose-Capillary: 161 mg/dL — ABNORMAL HIGH (ref 70–99)
Glucose-Capillary: 219 mg/dL — ABNORMAL HIGH (ref 70–99)

## 2013-05-05 LAB — CREATININE, SERUM: GFR calc non Af Amer: 74 mL/min — ABNORMAL LOW (ref >90)

## 2013-05-05 SURGERY — ARTHROPLASTY, KNEE, TOTAL
Anesthesia: General | Site: Knee | Laterality: Left | Wound class: Clean

## 2013-05-05 MED ORDER — MONTELUKAST SODIUM 10 MG PO TABS
10.0000 mg | ORAL_TABLET | Freq: Every day | ORAL | Status: DC
  Administered 2013-05-05 – 2013-05-08 (×4): 10 mg via ORAL
  Filled 2013-05-05 (×4): qty 1

## 2013-05-05 MED ORDER — PREGABALIN 50 MG PO CAPS
300.0000 mg | ORAL_CAPSULE | Freq: Every day | ORAL | Status: DC
  Administered 2013-05-05 – 2013-05-07 (×3): 300 mg via ORAL
  Filled 2013-05-05 (×3): qty 6

## 2013-05-05 MED ORDER — OXYCODONE HCL 5 MG PO TABS: ORAL_TABLET | ORAL | Status: DC

## 2013-05-05 MED ORDER — SODIUM CHLORIDE 0.9 % IV SOLN: INTRAVENOUS | Status: DC

## 2013-05-05 MED ORDER — ZOLPIDEM TARTRATE 5 MG PO TABS
5.0000 mg | ORAL_TABLET | Freq: Every day | ORAL | Status: DC
  Administered 2013-05-05 – 2013-05-06 (×2): 5 mg via ORAL
  Filled 2013-05-05 (×2): qty 1

## 2013-05-05 MED ORDER — METHOCARBAMOL 500 MG PO TABS
500.0000 mg | ORAL_TABLET | Freq: Four times a day (QID) | ORAL | Status: DC | PRN
  Administered 2013-05-05 (×2): 500 mg via ORAL
  Filled 2013-05-05 (×3): qty 1

## 2013-05-05 MED ORDER — PREGABALIN 50 MG PO CAPS: 150.0000 mg | ORAL_CAPSULE | Freq: Two times a day (BID) | ORAL | Status: DC

## 2013-05-05 MED ORDER — ENOXAPARIN SODIUM 40 MG/0.4ML ~~LOC~~ SOLN
40.0000 mg | SUBCUTANEOUS | Status: DC
  Administered 2013-05-06 – 2013-05-08 (×3): 40 mg via SUBCUTANEOUS
  Filled 2013-05-05 (×4): qty 0.4

## 2013-05-05 MED ORDER — ENALAPRIL MALEATE 5 MG PO TABS
5.0000 mg | ORAL_TABLET | Freq: Every day | ORAL | Status: DC
  Administered 2013-05-05 – 2013-05-08 (×4): 5 mg via ORAL
  Filled 2013-05-05 (×4): qty 1

## 2013-05-05 MED ORDER — DOCUSATE SODIUM 100 MG PO CAPS
100.0000 mg | ORAL_CAPSULE | Freq: Two times a day (BID) | ORAL | Status: DC
  Administered 2013-05-05 – 2013-05-08 (×7): 100 mg via ORAL
  Filled 2013-05-05 (×7): qty 1

## 2013-05-05 MED ORDER — GLYCOPYRROLATE 0.2 MG/ML IJ SOLN
INTRAMUSCULAR | Status: DC | PRN
  Administered 2013-05-05: 0.6 mg via INTRAVENOUS

## 2013-05-05 MED ORDER — INFLUENZA VAC SPLIT QUAD 0.5 ML IM SUSP
0.5000 mL | INTRAMUSCULAR | Status: AC
  Administered 2013-05-06: 0.5 mL via INTRAMUSCULAR
  Filled 2013-05-05: qty 0.5

## 2013-05-05 MED ORDER — HYDROMORPHONE HCL PF 1 MG/ML IJ SOLN
INTRAMUSCULAR | Status: AC
  Filled 2013-05-05: qty 1

## 2013-05-05 MED ORDER — METHOCARBAMOL 500 MG PO TABS
ORAL_TABLET | ORAL | Status: AC
  Filled 2013-05-05: qty 1

## 2013-05-05 MED ORDER — INSULIN ASPART 100 UNIT/ML ~~LOC~~ SOLN
0.0000 [IU] | Freq: Three times a day (TID) | SUBCUTANEOUS | Status: DC
  Administered 2013-05-05: 7 [IU] via SUBCUTANEOUS
  Administered 2013-05-06 – 2013-05-07 (×3): 4 [IU] via SUBCUTANEOUS
  Administered 2013-05-07: 7 [IU] via SUBCUTANEOUS
  Administered 2013-05-07: 3 [IU] via SUBCUTANEOUS
  Administered 2013-05-08 (×2): 4 [IU] via SUBCUTANEOUS

## 2013-05-05 MED ORDER — ACETAMINOPHEN 325 MG PO TABS
650.0000 mg | ORAL_TABLET | Freq: Four times a day (QID) | ORAL | Status: DC | PRN
  Administered 2013-05-07 – 2013-05-08 (×4): 650 mg via ORAL
  Filled 2013-05-05 (×4): qty 2

## 2013-05-05 MED ORDER — ACETAMINOPHEN 10 MG/ML IV SOLN
INTRAVENOUS | Status: AC
  Filled 2013-05-05: qty 100

## 2013-05-05 MED ORDER — ONDANSETRON HCL 4 MG/2ML IJ SOLN
4.0000 mg | Freq: Four times a day (QID) | INTRAMUSCULAR | Status: DC | PRN
  Administered 2013-05-05: 4 mg via INTRAVENOUS
  Filled 2013-05-05: qty 2

## 2013-05-05 MED ORDER — SODIUM CHLORIDE 0.9 % IR SOLN
Status: DC | PRN
  Administered 2013-05-05: 3000 mL

## 2013-05-05 MED ORDER — PANTOPRAZOLE SODIUM 40 MG PO TBEC
40.0000 mg | DELAYED_RELEASE_TABLET | Freq: Every day | ORAL | Status: DC
  Administered 2013-05-06 – 2013-05-08 (×3): 40 mg via ORAL
  Filled 2013-05-05 (×2): qty 1

## 2013-05-05 MED ORDER — MIDAZOLAM HCL 5 MG/5ML IJ SOLN
INTRAMUSCULAR | Status: DC | PRN
  Administered 2013-05-05 (×2): 1 mg via INTRAVENOUS

## 2013-05-05 MED ORDER — NITROGLYCERIN 0.4 MG SL SUBL: 0.4000 mg | SUBLINGUAL_TABLET | SUBLINGUAL | Status: DC | PRN

## 2013-05-05 MED ORDER — MENTHOL 3 MG MT LOZG: 1.0000 | LOZENGE | OROMUCOSAL | Status: DC | PRN

## 2013-05-05 MED ORDER — OXYCODONE HCL 5 MG PO TABS
5.0000 mg | ORAL_TABLET | ORAL | Status: DC | PRN
  Administered 2013-05-05 – 2013-05-08 (×10): 10 mg via ORAL
  Filled 2013-05-05 (×12): qty 2

## 2013-05-05 MED ORDER — SENNOSIDES-DOCUSATE SODIUM 8.6-50 MG PO TABS
1.0000 | ORAL_TABLET | Freq: Every evening | ORAL | Status: DC | PRN
  Administered 2013-05-07: 1 via ORAL
  Filled 2013-05-05: qty 1

## 2013-05-05 MED ORDER — 0.9 % SODIUM CHLORIDE (POUR BTL) OPTIME
TOPICAL | Status: DC | PRN
  Administered 2013-05-05: 1000 mL

## 2013-05-05 MED ORDER — LIDOCAINE HCL (CARDIAC) 20 MG/ML IV SOLN
INTRAVENOUS | Status: DC | PRN
  Administered 2013-05-05: 80 mg via INTRAVENOUS

## 2013-05-05 MED ORDER — SODIUM CHLORIDE 0.9 % IJ SOLN
INTRAMUSCULAR | Status: AC
  Filled 2013-05-05: qty 3

## 2013-05-05 MED ORDER — NEOSTIGMINE METHYLSULFATE 1 MG/ML IJ SOLN
INTRAMUSCULAR | Status: DC | PRN
  Administered 2013-05-05: 4 mg via INTRAVENOUS

## 2013-05-05 MED ORDER — FLEET ENEMA 7-19 GM/118ML RE ENEM: 1.0000 | ENEMA | Freq: Once | RECTAL | Status: AC | PRN

## 2013-05-05 MED ORDER — METHOCARBAMOL 100 MG/ML IJ SOLN
500.0000 mg | Freq: Four times a day (QID) | INTRAVENOUS | Status: DC | PRN
  Filled 2013-05-05: qty 5

## 2013-05-05 MED ORDER — AMITRIPTYLINE HCL 25 MG PO TABS
25.0000 mg | ORAL_TABLET | Freq: Every day | ORAL | Status: DC
  Administered 2013-05-05 – 2013-05-07 (×3): 25 mg via ORAL
  Filled 2013-05-05 (×4): qty 1

## 2013-05-05 MED ORDER — ROCURONIUM BROMIDE 100 MG/10ML IV SOLN
INTRAVENOUS | Status: DC | PRN
  Administered 2013-05-05: 30 mg via INTRAVENOUS

## 2013-05-05 MED ORDER — METOPROLOL SUCCINATE ER 25 MG PO TB24
25.0000 mg | ORAL_TABLET | Freq: Every day | ORAL | Status: DC
  Administered 2013-05-06 – 2013-05-08 (×3): 25 mg via ORAL
  Filled 2013-05-05 (×3): qty 1

## 2013-05-05 MED ORDER — METHOCARBAMOL 500 MG PO TABS: 500.0000 mg | ORAL_TABLET | Freq: Four times a day (QID) | ORAL | Status: DC

## 2013-05-05 MED ORDER — ALBUTEROL SULFATE HFA 108 (90 BASE) MCG/ACT IN AERS
INHALATION_SPRAY | RESPIRATORY_TRACT | Status: DC | PRN
  Administered 2013-05-05: 2 via RESPIRATORY_TRACT

## 2013-05-05 MED ORDER — METOCLOPRAMIDE HCL 10 MG PO TABS: 5.0000 mg | ORAL_TABLET | Freq: Three times a day (TID) | ORAL | Status: DC | PRN

## 2013-05-05 MED ORDER — VITAMIN D 50 MCG (2000 UT) PO TABS
2000.0000 [IU] | ORAL_TABLET | Freq: Every day | ORAL | Status: DC
Start: 2013-05-05 — End: 2013-05-05

## 2013-05-05 MED ORDER — ATORVASTATIN CALCIUM 80 MG PO TABS
80.0000 mg | ORAL_TABLET | Freq: Every day | ORAL | Status: DC
  Administered 2013-05-05 – 2013-05-07 (×2): 80 mg via ORAL
  Filled 2013-05-05 (×4): qty 1

## 2013-05-05 MED ORDER — INSULIN ASPART 100 UNIT/ML ~~LOC~~ SOLN: 0.0000 [IU] | Freq: Every day | SUBCUTANEOUS | Status: DC

## 2013-05-05 MED ORDER — ACETAMINOPHEN 10 MG/ML IV SOLN
1000.0000 mg | Freq: Once | INTRAVENOUS | Status: AC
  Administered 2013-05-05: 1000 mg via INTRAVENOUS

## 2013-05-05 MED ORDER — BISACODYL 10 MG RE SUPP: 10.0000 mg | Freq: Every day | RECTAL | Status: DC | PRN

## 2013-05-05 MED ORDER — ACETAMINOPHEN 650 MG RE SUPP: 650.0000 mg | Freq: Four times a day (QID) | RECTAL | Status: DC | PRN

## 2013-05-05 MED ORDER — INSULIN ASPART 100 UNIT/ML ~~LOC~~ SOLN
6.0000 [IU] | Freq: Three times a day (TID) | SUBCUTANEOUS | Status: DC
  Administered 2013-05-06 – 2013-05-08 (×6): 6 [IU] via SUBCUTANEOUS

## 2013-05-05 MED ORDER — ALBUTEROL SULFATE (5 MG/ML) 0.5% IN NEBU
INHALATION_SOLUTION | RESPIRATORY_TRACT | Status: AC
  Filled 2013-05-05: qty 0.5

## 2013-05-05 MED ORDER — HYDROMORPHONE HCL PF 1 MG/ML IJ SOLN: 0.2500 mg | INTRAMUSCULAR | Status: DC | PRN

## 2013-05-05 MED ORDER — HYDROMORPHONE HCL PF 1 MG/ML IJ SOLN
1.0000 mg | INTRAMUSCULAR | Status: DC | PRN
  Administered 2013-05-05 – 2013-05-06 (×6): 1 mg via INTRAVENOUS
  Filled 2013-05-05 (×6): qty 1

## 2013-05-05 MED ORDER — ALBUTEROL SULFATE (5 MG/ML) 0.5% IN NEBU
2.5000 mg | INHALATION_SOLUTION | Freq: Four times a day (QID) | RESPIRATORY_TRACT | Status: DC | PRN
  Administered 2013-05-05: 2.5 mg via RESPIRATORY_TRACT

## 2013-05-05 MED ORDER — VITAMIN D3 25 MCG (1000 UNIT) PO TABS
2000.0000 [IU] | ORAL_TABLET | Freq: Every day | ORAL | Status: DC
  Administered 2013-05-05 – 2013-05-08 (×4): 2000 [IU] via ORAL
  Filled 2013-05-05 (×4): qty 2

## 2013-05-05 MED ORDER — ARTIFICIAL TEARS OP OINT
TOPICAL_OINTMENT | OPHTHALMIC | Status: DC | PRN
  Administered 2013-05-05: 1 via OPHTHALMIC

## 2013-05-05 MED ORDER — FENTANYL CITRATE 0.05 MG/ML IJ SOLN
INTRAMUSCULAR | Status: DC | PRN
  Administered 2013-05-05 (×2): 50 ug via INTRAVENOUS
  Administered 2013-05-05: 100 ug via INTRAVENOUS
  Administered 2013-05-05: 150 ug via INTRAVENOUS
  Administered 2013-05-05 (×2): 50 ug via INTRAVENOUS

## 2013-05-05 MED ORDER — PHENOL 1.4 % MT LIQD: 1.0000 | OROMUCOSAL | Status: DC | PRN

## 2013-05-05 MED ORDER — ONDANSETRON HCL 4 MG PO TABS: 4.0000 mg | ORAL_TABLET | Freq: Four times a day (QID) | ORAL | Status: DC | PRN

## 2013-05-05 MED ORDER — CEFAZOLIN SODIUM-DEXTROSE 2-3 GM-% IV SOLR
2.0000 g | Freq: Four times a day (QID) | INTRAVENOUS | Status: AC
  Administered 2013-05-05 (×2): 2 g via INTRAVENOUS
  Filled 2013-05-05 (×2): qty 50

## 2013-05-05 MED ORDER — PROPOFOL 10 MG/ML IV BOLUS
INTRAVENOUS | Status: DC | PRN
  Administered 2013-05-05: 200 mg via INTRAVENOUS

## 2013-05-05 MED ORDER — ASPIRIN EC 81 MG PO TBEC
81.0000 mg | DELAYED_RELEASE_TABLET | Freq: Every day | ORAL | Status: DC
  Administered 2013-05-05 – 2013-05-08 (×4): 81 mg via ORAL
  Filled 2013-05-05 (×4): qty 1

## 2013-05-05 MED ORDER — TRIAMTERENE-HCTZ 37.5-25 MG PO TABS
1.0000 | ORAL_TABLET | Freq: Every day | ORAL | Status: DC
  Administered 2013-05-05: 1 via ORAL
  Filled 2013-05-05 (×2): qty 1

## 2013-05-05 MED ORDER — BUDESONIDE-FORMOTEROL FUMARATE 160-4.5 MCG/ACT IN AERO
2.0000 | INHALATION_SPRAY | Freq: Every day | RESPIRATORY_TRACT | Status: DC
  Administered 2013-05-05 – 2013-05-08 (×4): 2 via RESPIRATORY_TRACT
  Filled 2013-05-05: qty 6

## 2013-05-05 MED ORDER — METOCLOPRAMIDE HCL 5 MG/ML IJ SOLN
5.0000 mg | Freq: Three times a day (TID) | INTRAMUSCULAR | Status: DC | PRN
  Administered 2013-05-05: 10 mg via INTRAVENOUS
  Filled 2013-05-05: qty 2

## 2013-05-05 MED ORDER — ADULT MULTIVITAMIN W/MINERALS CH
1.0000 | ORAL_TABLET | Freq: Every day | ORAL | Status: DC
  Administered 2013-05-06 – 2013-05-08 (×3): 1 via ORAL
  Filled 2013-05-05 (×4): qty 1

## 2013-05-05 MED ORDER — ESTRADIOL 0.025 MG/24HR TD PTWK
0.0250 mg | MEDICATED_PATCH | TRANSDERMAL | Status: DC
  Administered 2013-05-07: 0.025 mg via TRANSDERMAL
  Filled 2013-05-05: qty 1

## 2013-05-05 MED ORDER — ALBUTEROL SULFATE HFA 108 (90 BASE) MCG/ACT IN AERS
2.0000 | INHALATION_SPRAY | Freq: Four times a day (QID) | RESPIRATORY_TRACT | Status: DC | PRN
  Filled 2013-05-05: qty 6.7

## 2013-05-05 MED ORDER — LACTATED RINGERS IV SOLN
INTRAVENOUS | Status: DC | PRN
  Administered 2013-05-05 (×2): via INTRAVENOUS

## 2013-05-05 MED ORDER — ONDANSETRON HCL 4 MG/2ML IJ SOLN
INTRAMUSCULAR | Status: DC | PRN
  Administered 2013-05-05: 4 mg via INTRAMUSCULAR

## 2013-05-05 MED ORDER — PREGABALIN 50 MG PO CAPS
150.0000 mg | ORAL_CAPSULE | Freq: Every day | ORAL | Status: DC
  Administered 2013-05-06 – 2013-05-08 (×3): 150 mg via ORAL
  Filled 2013-05-05 (×3): qty 3

## 2013-05-05 MED ORDER — PHENYLEPHRINE HCL 10 MG/ML IJ SOLN
INTRAMUSCULAR | Status: DC | PRN
  Administered 2013-05-05: 80 ug via INTRAVENOUS
  Administered 2013-05-05 (×2): 40 ug via INTRAVENOUS
  Administered 2013-05-05: 120 ug via INTRAVENOUS
  Administered 2013-05-05: 40 ug via INTRAVENOUS
  Administered 2013-05-05: 80 ug via INTRAVENOUS

## 2013-05-05 MED ORDER — HYDROMORPHONE HCL PF 1 MG/ML IJ SOLN
0.2500 mg | INTRAMUSCULAR | Status: DC | PRN
  Administered 2013-05-05 (×4): 0.5 mg via INTRAVENOUS

## 2013-05-05 SURGICAL SUPPLY — 65 items
BANDAGE ELASTIC 4 VELCRO ST LF (GAUZE/BANDAGES/DRESSINGS) ×2 IMPLANT
BANDAGE ELASTIC 6 VELCRO ST LF (GAUZE/BANDAGES/DRESSINGS) ×2 IMPLANT
BANDAGE ESMARK 6X9 LF (GAUZE/BANDAGES/DRESSINGS) ×1 IMPLANT
BLADE SAGITTAL 25.0X1.19X90 (BLADE) ×2 IMPLANT
BLADE SAW SAG 90X13X1.27 (BLADE) ×2 IMPLANT
BNDG ESMARK 6X9 LF (GAUZE/BANDAGES/DRESSINGS) ×2
BONE CEMENT GENTAMICIN (Cement) ×4 IMPLANT
BOWL SMART MIX CTS (DISPOSABLE) ×2 IMPLANT
CAPT RP KNEE ×2 IMPLANT
CEMENT BONE GENTAMICIN 40 (Cement) ×2 IMPLANT
CLOTH BEACON ORANGE TIMEOUT ST (SAFETY) ×2 IMPLANT
COVER SURGICAL LIGHT HANDLE (MISCELLANEOUS) ×2 IMPLANT
CUFF TOURNIQUET SINGLE 34IN LL (TOURNIQUET CUFF) ×2 IMPLANT
CUFF TOURNIQUET SINGLE 44IN (TOURNIQUET CUFF) IMPLANT
DRAPE INCISE IOBAN 66X45 STRL (DRAPES) IMPLANT
DRAPE ORTHO SPLIT 77X108 STRL (DRAPES) ×2
DRAPE SURG ORHT 6 SPLT 77X108 (DRAPES) ×2 IMPLANT
DRAPE U-SHAPE 47X51 STRL (DRAPES) ×2 IMPLANT
DRSG ADAPTIC 3X8 NADH LF (GAUZE/BANDAGES/DRESSINGS) ×2 IMPLANT
DRSG PAD ABDOMINAL 8X10 ST (GAUZE/BANDAGES/DRESSINGS) ×2 IMPLANT
DURAPREP 26ML APPLICATOR (WOUND CARE) ×2 IMPLANT
ELECT REM PT RETURN 9FT ADLT (ELECTROSURGICAL) ×2
ELECTRODE REM PT RTRN 9FT ADLT (ELECTROSURGICAL) ×1 IMPLANT
EVACUATOR 1/8 PVC DRAIN (DRAIN) ×2 IMPLANT
FACESHIELD LNG OPTICON STERILE (SAFETY) ×4 IMPLANT
FLOSEAL 10ML (HEMOSTASIS) IMPLANT
GLOVE BIO SURGEON STRL SZ 6.5 (GLOVE) ×2 IMPLANT
GLOVE BIOGEL PI IND STRL 7.0 (GLOVE) ×1 IMPLANT
GLOVE BIOGEL PI IND STRL 8 (GLOVE) ×4 IMPLANT
GLOVE BIOGEL PI INDICATOR 7.0 (GLOVE) ×1
GLOVE BIOGEL PI INDICATOR 8 (GLOVE) ×4
GLOVE ORTHO TXT STRL SZ7.5 (GLOVE) ×6 IMPLANT
GLOVE SURG ORTHO 8.0 STRL STRW (GLOVE) ×6 IMPLANT
GOWN PREVENTION PLUS XLARGE (GOWN DISPOSABLE) ×2 IMPLANT
GOWN PREVENTION PLUS XXLARGE (GOWN DISPOSABLE) ×2 IMPLANT
GOWN STRL NON-REIN LRG LVL3 (GOWN DISPOSABLE) ×4 IMPLANT
HANDPIECE INTERPULSE COAX TIP (DISPOSABLE) ×1
HOOD PEEL AWAY FACE SHEILD DIS (HOOD) ×2 IMPLANT
IMMOBILIZER KNEE 20 (SOFTGOODS) ×2
IMMOBILIZER KNEE 20 THIGH 36 (SOFTGOODS) ×1 IMPLANT
IMMOBILIZER KNEE 22 UNIV (SOFTGOODS) ×2 IMPLANT
KIT BASIN OR (CUSTOM PROCEDURE TRAY) ×2 IMPLANT
KIT ROOM TURNOVER OR (KITS) ×2 IMPLANT
MANIFOLD NEPTUNE II (INSTRUMENTS) ×2 IMPLANT
NEEDLE 22X1 1/2 (OR ONLY) (NEEDLE) IMPLANT
NS IRRIG 1000ML POUR BTL (IV SOLUTION) ×2 IMPLANT
PACK TOTAL JOINT (CUSTOM PROCEDURE TRAY) ×2 IMPLANT
PAD ARMBOARD 7.5X6 YLW CONV (MISCELLANEOUS) ×4 IMPLANT
PAD CAST 4YDX4 CTTN HI CHSV (CAST SUPPLIES) ×1 IMPLANT
PADDING CAST COTTON 4X4 STRL (CAST SUPPLIES) ×1
PADDING CAST COTTON 6X4 STRL (CAST SUPPLIES) ×2 IMPLANT
SET HNDPC FAN SPRY TIP SCT (DISPOSABLE) ×1 IMPLANT
SPONGE GAUZE 4X4 12PLY (GAUZE/BANDAGES/DRESSINGS) ×2 IMPLANT
STAPLER VISISTAT 35W (STAPLE) ×2 IMPLANT
SUCTION FRAZIER TIP 10 FR DISP (SUCTIONS) ×2 IMPLANT
SUT ETHIBOND NAB CT1 #1 30IN (SUTURE) ×6 IMPLANT
SUT VIC AB 0 CT1 27 (SUTURE) ×2
SUT VIC AB 0 CT1 27XBRD ANBCTR (SUTURE) ×2 IMPLANT
SUT VIC AB 2-0 CT1 27 (SUTURE) ×3
SUT VIC AB 2-0 CT1 TAPERPNT 27 (SUTURE) ×3 IMPLANT
SYR CONTROL 10ML LL (SYRINGE) IMPLANT
TOWEL OR 17X24 6PK STRL BLUE (TOWEL DISPOSABLE) ×2 IMPLANT
TOWEL OR 17X26 10 PK STRL BLUE (TOWEL DISPOSABLE) ×2 IMPLANT
TRAY FOLEY CATH 16FRSI W/METER (SET/KITS/TRAYS/PACK) ×2 IMPLANT
WATER STERILE IRR 1000ML POUR (IV SOLUTION) ×4 IMPLANT

## 2013-05-05 NOTE — H&P (View-Only) (Signed)
TOTAL KNEE ADMISSION H&P  Patient is being admitted for left total knee arthroplasty.  Subjective:  Chief Complaint:left knee pain.  HPI: Abigail Townsend, 57 y.o. female, has a history of pain and functional disability in the left knee due to arthritis and has failed non-surgical conservative treatments for greater than 12 weeks to includeNSAID's and/or analgesics, corticosteriod injections, viscosupplementation injections, use of assistive devices and activity modification.  Onset of symptoms was gradual, starting 1 years ago with gradually worsening course since that time. The patient noted no past surgery on the left knee(s).  Patient currently rates pain in the left knee(s) at 9 out of 10 with activity. Patient has night pain, worsening of pain with activity and weight bearing, pain that interferes with activities of daily living, pain with passive range of motion and joint swelling.  Patient has evidence of periarticular osteophytes and joint space narrowing by imaging studies.  There is no active infection.  Patient Active Problem List   Diagnosis Date Noted  . Impingement syndrome of left shoulder 06/05/2011   Past Medical History  Diagnosis Date  . Heart murmur     since birth   . Arthritis     osteo rt knee   . Asthma     Albuterol Prn;takes Sindulair daily and uses Symbicort daily  . Insomnia     takes Elavil nightly and Ambien nightly  . GERD (gastroesophageal reflux disease)     takes Protonix daily  . Hypertension     takes Enalapril and Metoprolol daily as well as Maxzide   . Diabetes mellitus without complication     takes Metformin and Januvia daily  . PONV (postoperative nausea and vomiting)   . Hyperlipidemia     takes Crestor nightly  . Coronary artery disease   . Headache(784.0)     takes Maxalt daily prn and Lyrica daily  . Joint pain   . Joint swelling   . Bruises easily     d/t being on Effient and baby ASA--cardiologist told pt not to stop these  . IBS  (irritable bowel syndrome)     amitiza bid  . Constipation   . History of kidney stones     passed on her on many yrs ago  . Anemia     hx of  . Sleep apnea     sleep study done a yr ago-to request  . Apnea, sleep     dr Perley Jain [phone]    Past Surgical History  Procedure Laterality Date  . Joint replacement      2009 rt knee   . Patella realignment  2010    rt knee   . Breast reduction surgery  1996     bilat   . Breast surgery  1997    suture repair rt breaast   . Abdominal hysterectomy      1994    only uterus  . Cholecystectomy      1981  . Breast lumpectomy  6/89  11/2003     right breast   benign   . Bladder repair  2007    bladder sling   . Laparoscopy  1987  . Cardiac catheterization      10/2010 12/2010  . Coronary angioplasty with stent placement  2 stents    10/2010  . Left shoulder surgery    . Esophagogastroduodenoscopy       (Not in a hospital admission) Allergies  Allergen Reactions  . Erythromycin Nausea And Vomiting  .  Other     Green peppers causes stomach swelling and severe abdominal pain, nausea, vomiting and diarrhea    History  Substance Use Topics  . Smoking status: Former Games developer  . Smokeless tobacco: Not on file     Comment: quit in 1998  . Alcohol Use: Yes     Comment: rarely    No family history on file.   Review of Systems  Constitutional: Negative.   HENT: Negative for congestion, ear discharge, ear pain, hearing loss, nosebleeds, sore throat and tinnitus.   Eyes: Negative.   Respiratory: Positive for shortness of breath. Negative for cough, hemoptysis, sputum production, wheezing and stridor.   Cardiovascular: Positive for chest pain and leg swelling. Negative for palpitations, orthopnea, claudication and PND.  Gastrointestinal: Positive for nausea, vomiting and constipation. Negative for heartburn, abdominal pain, diarrhea, blood in stool and melena.  Genitourinary: Positive for frequency. Negative for dysuria, urgency,  hematuria and flank pain.  Musculoskeletal: Positive for joint pain.  Skin: Negative.   Neurological: Positive for headaches. Negative for dizziness, tingling, tremors, sensory change, speech change, focal weakness, seizures and loss of consciousness.  Endo/Heme/Allergies: Negative for environmental allergies and polydipsia. Bruises/bleeds easily.  Psychiatric/Behavioral: Negative.     Objective:  Physical Exam  Constitutional: She is oriented to person, place, and time. She appears well-developed and well-nourished. No distress.  HENT:  Head: Normocephalic and atraumatic.  Nose: Nose normal.  Eyes: Conjunctivae and EOM are normal. Pupils are equal, round, and reactive to light.  Neck: Normal range of motion. Neck supple.  Cardiovascular: Normal rate, regular rhythm and intact distal pulses.   Murmur heard. Respiratory: Effort normal and breath sounds normal. No respiratory distress. She has no wheezes. She has no rales. She exhibits no tenderness.  GI: Soft. Bowel sounds are normal. She exhibits no distension. There is no tenderness.  Musculoskeletal:       Left knee: She exhibits decreased range of motion, swelling and bony tenderness. She exhibits no effusion, no ecchymosis and no erythema. Tenderness found. Medial joint line and lateral joint line tenderness noted.  Lymphadenopathy:    She has no cervical adenopathy.  Neurological: She is alert and oriented to person, place, and time. No cranial nerve deficit.  Skin: Skin is warm and dry. No rash noted. No erythema.  Psychiatric: She has a normal mood and affect. Her behavior is normal.    Vital signs in last 24 hours: @VSRANGES @  Labs:   Estimated body mass index is 47.73 kg/(m^2) as calculated from the following:   Height as of 06/06/11: 5\' 5"  (1.651 m).   Weight as of 06/03/11: 130.1 kg (286 lb 13.1 oz).   Imaging Review Plain radiographs demonstrate severe degenerative joint disease of the left knee(s). The overall  alignment ismild varus. The bone quality appears to be good for age and reported activity level.  Assessment/Plan:  End stage arthritis, left knee   The patient history, physical examination, clinical judgment of the provider and imaging studies are consistent with end stage degenerative joint disease of the left knee(s) and total knee arthroplasty is deemed medically necessary. The treatment options including medical management, injection therapy arthroscopy and arthroplasty were discussed at length. The risks and benefits of total knee arthroplasty were presented and reviewed. The risks due to aseptic loosening, infection, stiffness, patella tracking problems, thromboembolic complications and other imponderables were discussed. The patient acknowledged the explanation, agreed to proceed with the plan and consent was signed. Patient is being admitted for inpatient treatment for  surgery, pain control, PT, OT, prophylactic antibiotics, VTE prophylaxis, progressive ambulation and ADL's and discharge planning. The patient is planning to be discharged home with home health services

## 2013-05-05 NOTE — Anesthesia Preprocedure Evaluation (Addendum)
Anesthesia Evaluation  Patient identified by MRN, date of birth, ID band Patient awake    Reviewed: Allergy & Precautions, H&P , NPO status , Patient's Chart, lab work & pertinent test results  Airway Mallampati: II      Dental   Pulmonary          Cardiovascular hypertension, + CAD Rhythm:Regular Rate:Normal     Neuro/Psych    GI/Hepatic Neg liver ROS, GERD-  ,  Endo/Other  diabetes  Renal/GU negative Renal ROS     Musculoskeletal   Abdominal   Peds  Hematology   Anesthesia Other Findings   Reproductive/Obstetrics                          Anesthesia Physical Anesthesia Plan  ASA: III  Anesthesia Plan: General   Post-op Pain Management: MAC Combined w/ Regional for Post-op pain   Induction: Intravenous  Airway Management Planned: Oral ETT  Additional Equipment:   Intra-op Plan:   Post-operative Plan: Extubation in OR  Informed Consent: I have reviewed the patients History and Physical, chart, labs and discussed the procedure including the risks, benefits and alternatives for the proposed anesthesia with the patient or authorized representative who has indicated his/her understanding and acceptance.   Dental advisory given  Plan Discussed with: CRNA and Anesthesiologist  Anesthesia Plan Comments:        Anesthesia Quick Evaluation

## 2013-05-05 NOTE — Anesthesia Postprocedure Evaluation (Signed)
  Anesthesia Post-op Note  Patient: Abigail Townsend  Procedure(s) Performed: Procedure(s): TOTAL KNEE ARTHROPLASTY (Left)  Patient Location: PACU  Anesthesia Type:General  Level of Consciousness: awake  Airway and Oxygen Therapy: Patient Spontanous Breathing  Post-op Pain: mild  Post-op Assessment: Post-op Vital signs reviewed  Post-op Vital Signs: Reviewed  Complications: No apparent anesthesia complications 

## 2013-05-05 NOTE — Brief Op Note (Signed)
05/05/2013  10:37 AM  PATIENT:  Abigail Townsend  57 y.o. female  PRE-OPERATIVE DIAGNOSIS:  OA LEFT KNEE  POST-OPERATIVE DIAGNOSIS:  OA left knee  PROCEDURE:  Procedure(s): TOTAL KNEE ARTHROPLASTY (Left)  SURGEON:  Surgeon(s) and Role:    * W D Carloyn Manner., MD - Primary  PHYSICIAN ASSISTANT: Margart Sickles, PA-C  ASSISTANTS:    ANESTHESIA:   regional and general  EBL:  Total I/O In: 1800 [I.V.:1800] Out: 250 [Urine:150; Blood:100]  BLOOD ADMINISTERED:none  DRAINS: (1) Hemovact drain(s) in the left lateral knee with  Suction Open   LOCAL MEDICATIONS USED:  NONE  SPECIMEN:  No Specimen  DISPOSITION OF SPECIMEN:  N/A  COUNTS:  YES  TOURNIQUET:   Total Tourniquet Time Documented: Thigh (Left) - 69 minutes Total: Thigh (Left) - 69 minutes   DICTATION: .Other Dictation: Dictation Number unknown  PLAN OF CARE: Admit to inpatient   PATIENT DISPOSITION:  PACU - hemodynamically stable.   Delay start of Pharmacological VTE agent (>24hrs) due to surgical blood loss or risk of bleeding: yes

## 2013-05-05 NOTE — Transfer of Care (Signed)
Immediate Anesthesia Transfer of Care Note  Patient: Abigail Townsend  Procedure(s) Performed: Procedure(s): TOTAL KNEE ARTHROPLASTY (Left)  Patient Location: PACU  Anesthesia Type:General  Level of Consciousness: awake, alert  and oriented  Airway & Oxygen Therapy: Patient Spontanous Breathing and Patient connected to nasal cannula oxygen  Post-op Assessment: Report given to PACU RN, Post -op Vital signs reviewed and stable and Patient moving all extremities X 4  Post vital signs: Reviewed and stable  Complications: No apparent anesthesia complications

## 2013-05-05 NOTE — Anesthesia Procedure Notes (Addendum)
Procedure Name: Intubation Date/Time: 05/05/2013 7:47 AM Performed by: Gayla Medicus Pre-anesthesia Checklist: Patient identified, Timeout performed, Emergency Drugs available, Suction available and Patient being monitored Patient Re-evaluated:Patient Re-evaluated prior to inductionOxygen Delivery Method: Circle system utilized Preoxygenation: Pre-oxygenation with 100% oxygen Intubation Type: IV induction Laryngoscope Size: Mac and 3 Grade View: Grade II Tube type: Oral Tube size: 7.5 mm Number of attempts: 1 Airway Equipment and Method: Stylet Placement Confirmation: ETT inserted through vocal cords under direct vision,  positive ETCO2 and breath sounds checked- equal and bilateral Secured at: 22 cm Tube secured with: Tape Dental Injury: Teeth and Oropharynx as per pre-operative assessment    Anesthesia Regional Block:  Femoral nerve block  Pre-Anesthetic Checklist: ,, timeout performed, Correct Patient, Correct Site, Correct Laterality, Correct Procedure, Correct Position, site marked, Risks and benefits discussed,  Surgical consent,  Pre-op evaluation,  At surgeon's request and post-op pain management   Prep: chloraprep       Needles:   Needle Type: Echogenic Stimulator Needle          Additional Needles:  Procedures: Doppler guided Femoral nerve block  Nerve Stimulator or Paresthesia:  Response: 0.5 mA,   Additional Responses:   Narrative:  Start time: 05/05/2013 7:00 AM End time: 05/05/2013 7:15 AM Injection made incrementally with aspirations every 5 mL.  Performed by: Personally  Anesthesiologist: Dr. Randa Evens

## 2013-05-05 NOTE — Evaluation (Signed)
Physical Therapy Evaluation Patient Details Name: Abigail Townsend MRN: [id number] DOB: 12/02/1955 Today's Date: 05/05/2013 Time: 4098-1191 PT Time Calculation (min): 29 min  PT Assessment / Plan / Recommendation History of Present Illness  57 y.o. female admitted to [facility] on 05/05/13 for elective L TKA.  She is WBAT and KI for gait.  She has h/o R TKA ~ 5 years ago and that knee is doing well per pt report.    Clinical Impression  Pt is POD #0 and is moving well despite having her eyes closed the entire session.  She will likely to progress well enough to return home at discharge with her husband's 24/7 assist.   PT to follow acutely for deficits listed below.       PT Assessment  Patient needs continued PT services    Follow Up Recommendations  Home health PT;Supervision for mobility/OOB    Does the patient have the potential to tolerate intense rehabilitation     NA  Barriers to Discharge   None      Equipment Recommendations  None recommended by PT    Recommendations for Other Services   None  Frequency 7X/week    Precautions / Restrictions Precautions Precautions: Knee Required Braces or Orthoses: Knee Immobilizer - Left Knee Immobilizer - Left: On when out of bed or walking Restrictions Weight Bearing Restrictions: No LLE Weight Bearing: Weight bearing as tolerated Other Position/Activity Restrictions: no pillow under left knee   Pertinent Vitals/Pain See vitals flow sheet.       Mobility  Bed Mobility Bed Mobility: Supine to Sit;Sitting - Scoot to Edge of Bed Supine to Sit: 4: Min assist;With rails;HOB elevated Sitting - Scoot to [place] of Bed: 4: Min assist;With rail Details for Bed Mobility Assistance: min assist to help progress left leg to EOB.  Verbal cues for sequencing and hand placement.  Transfers Transfers: Sit to Stand;Stand to [employer] Transfers Sit to Stand: 4: Min assist;With upper extremity assist;With armrests;From bed Stand to Sit: 4:  Min assist;With upper extremity assist;With armrests;To chair/3-in-1 Stand Pivot Transfers: 4: Min assist;With armrests Details for Transfer Assistance: min assist to stand and pivot from bed to recliner chair on pt's right with RW. Assist needed to support trunk.  Verbal cues for weight shift and hand placement.  Ambulation/Gait Ambulation/Gait Assistance: Not tested (comment) (just pivoting to the chair due to pt is not alert enough )        PT Diagnosis: Difficulty walking;Abnormality of gait;Generalized weakness;Acute pain  PT Problem List: Decreased strength;Decreased range of motion;Decreased activity tolerance;Decreased balance;Decreased mobility;Decreased knowledge of use of DME;Decreased knowledge of precautions;Pain;Obesity PT Treatment Interventions: DME instruction;Gait training;Functional mobility training;Therapeutic activities;Therapeutic exercise;Balance training;Neuromuscular re-education;Patient/family education;Modalities     PT Goals(Current goals can be found in the care plan section) Acute Rehab PT Goals Patient Stated Goal: to go home PT Goal Formulation: With patient Time For Goal Achievement: 05/12/13 Potential to Achieve Goals: Good  Visit Information  Last PT Received On: 05/05/13 Assistance Needed: +1 History of Present Illness: 57 y.o. female admitted to [facility] on 05/05/13 for elective L TKA.  She is WBAT and KI for gait.  She has h/o R TKA ~ 5 years ago and that knee is doing well per pt report.         Prior Functioning  Home Living Family/patient expects to be discharged to:: Private residence Living Arrangements: Spouse/significant other Available Help at Discharge: Family;Available 24 hours/day Type of Home: House Home Access: Level entry Home Layout: One  level;Laundry or work area in [employer] Equipment: Environmental consultant - 2 wheels;Bedside commode Additional Comments: pt reports she needs a shower seat Prior Function Level of Independence:  Independent Comments: works as a [employer] with a 51 y.o. boy.   Communication Communication: No difficulties    Cognition  Cognition Arousal/Alertness: Lethargic;Suspect due to medications Behavior During Therapy: Flat affect Overall Cognitive Status: Difficult to assess Difficult to assess due to: Level of arousal    Extremity/Trunk Assessment Upper Extremity Assessment Upper Extremity Assessment: Defer to OT evaluation Lower Extremity Assessment Lower Extremity Assessment: LLE deficits/detail LLE Deficits / Details: normal post-op weakness, 4/5 ankle, 2/5 knee, 2+/5 hip Cervical / Trunk Assessment Cervical / Trunk Assessment: Normal      End of Session PT - End of Session Equipment Utilized During Treatment: Left knee immobilizer Activity Tolerance: Patient limited by fatigue;Patient limited by pain;Patient limited by lethargy Patient left: in chair;with call bell/phone within reach Nurse Communication: Mobility status CPM Left Knee CPM Left Knee: Off Left Knee Flexion (Degrees): 50 Left Knee Extension (Degrees): 0    Ples Trudel B. Shynice Sigel, PT, DPT [phone]   05/05/2013, 4:28 PM

## 2013-05-05 NOTE — Interval H&P Note (Signed)
History and Physical Interval Note:  05/05/2013 7:34 AM  Abigail Townsend  has presented today for surgery, with the diagnosis of OA LEFT KNEE  The various methods of treatment have been discussed with the patient and family. After consideration of risks, benefits and other options for treatment, the patient has consented to  Procedure(s): TOTAL KNEE ARTHROPLASTY (Left) as a surgical intervention .  The patient's history has been reviewed, patient examined, no change in status, stable for surgery.  I have reviewed the patient's chart and labs.  Questions were answered to the patient's satisfaction.     Yulia Ulrich JR,W D

## 2013-05-05 NOTE — Op Note (Signed)
Abigail Townsend, Abigail Townsend               ACCOUNT NO.:  [id number]  MEDICAL RECORD NO.:  [id number]  LOCATION:  [facility]                         FACILITY:  [facility]  PHYSICIAN:  Dyke Brackett, M.D.    DATE OF BIRTH:  08-03-1955  DATE OF PROCEDURE:  05/05/2013 DATE OF DISCHARGE:                              OPERATIVE REPORT   INDICATIONS:  The patient with severe osteoarthritis of the knee status post right total knee with intractable knee pain, end-stage arthritis, left knee thought to be amenable to hospitalization knee replacement.  PREOPERATIVE DIAGNOSIS:  Osteoarthritis, left knee.  POSTOPERATIVE DIAGNOSIS:  Osteoarthritis, left knee.  OPERATION:  Left total knee replacement (Sigma cemented knee, size 3 femur, size 3 tibia, 12.5 mm bearing with 35 mm all poly patella).  ANESTHESIA:  General anesthetic with femoral nerve block.  TOURNIQUET TIME:  Approximately 1 hour and 5 minutes.  DESCRIPTION OF PROCEDURE:  After sterile prep and drape, exsanguination of leg due to the patient's large foot, we had tourniquet on 375. Straight skin incision with medial parapatellar approach to the knee made.  We were not able to really evert the patella.  We just translocated it laterally.  We cut 11 mm off the distal femur with a 5- degree valgus inclination cut about 3-4 mm below the most diseased medial compartment with appropriate amount of valgus on the tibia with the extension gap measured at 12.5 mm.  Then sized the femur to be a size 3, 12.5 mm bearing with external rotation.  Placed 2 guide pins for the femoral cutting block and then cut the anterior-posterior chamfer cuts.  Excess menisci were removed as well as complete release of the PCL and some posterior osteophytes.  Flexion gap balanced equally, extension gap 12.5 mm.  Keel hole was cut for the tibia.  Tibial trial was placed in the box cut for the femur.  We then placed the femoral tibial trials with a 12.5 mm bearing, measured the  patella leaving about 15 mm of native patella resecting the patella for all poly patellar trial.  All trials were placed, 0 extension, good stability to varus valgus.  Restoration to the mechanical axis was noted.  The final components were inserted.  Bony surfaces were irrigated.  Due to the patient's history of diabetes, we used antibiotic impregnated cement, inserted the tibia followed by the femur and patella.  We elected to use a trial bearing and let the cement hardened.  Trial bearing was removed.  Excess cement was removed from the posterior aspect of the knee.  Tourniquet was released.  Prior to placement of the final bearing, no excessive bleeding was noted in the posterior aspect of the knee.  Small bleeders were coagulated.  Closure was then effected with #1 Ethibond, 0 and 2-0 Vicryl and skin clips.  Lightly compressive sterile dressing applied. Taken to the recovery room in a good condition.     Dyke Brackett, M.D.     WDC/MEDQ  D:  05/05/2013  T:  05/05/2013  Job:  [id number]

## 2013-05-05 NOTE — Preoperative (Signed)
Beta Blockers   Reason not to administer Beta Blockers:Not Applicable 

## 2013-05-05 NOTE — Anesthesia Postprocedure Evaluation (Signed)
  Anesthesia Post-op Note  Patient: Abigail Townsend  Procedure(s) Performed: Procedure(s): TOTAL KNEE ARTHROPLASTY (Left)  Patient Location: PACU  Anesthesia Type:General  Level of Consciousness: awake  Airway and Oxygen Therapy: Patient Spontanous Breathing  Post-op Pain: mild  Post-op Assessment: Post-op Vital signs reviewed  Post-op Vital Signs: Reviewed  Complications: No apparent anesthesia complications

## 2013-05-05 NOTE — Progress Notes (Signed)
Orthopedic Tech Progress Note Patient Details:  Abigail Townsend September 11, 1955 [id number] Applied CPM to LLE.  Applied OHF with trapeze to pt.'s bed. CPM Left Knee Left Knee Flexion (Degrees): 60 Left Knee Extension (Degrees): 0   Lesle Chris 05/05/2013, 11:36 AM

## 2013-05-06 ENCOUNTER — Encounter (HOSPITAL_COMMUNITY): Payer: Self-pay | Admitting: Physician Assistant

## 2013-05-06 DIAGNOSIS — K219 Gastro-esophageal reflux disease without esophagitis: Secondary | ICD-10-CM | POA: Diagnosis present

## 2013-05-06 DIAGNOSIS — K589 Irritable bowel syndrome without diarrhea: Secondary | ICD-10-CM | POA: Diagnosis present

## 2013-05-06 DIAGNOSIS — E785 Hyperlipidemia, unspecified: Secondary | ICD-10-CM | POA: Diagnosis present

## 2013-05-06 DIAGNOSIS — Z96659 Presence of unspecified artificial knee joint: Secondary | ICD-10-CM

## 2013-05-06 DIAGNOSIS — M1990 Unspecified osteoarthritis, unspecified site: Secondary | ICD-10-CM | POA: Diagnosis present

## 2013-05-06 DIAGNOSIS — G4700 Insomnia, unspecified: Secondary | ICD-10-CM | POA: Diagnosis present

## 2013-05-06 DIAGNOSIS — R112 Nausea with vomiting, unspecified: Secondary | ICD-10-CM | POA: Diagnosis present

## 2013-05-06 DIAGNOSIS — E119 Type 2 diabetes mellitus without complications: Secondary | ICD-10-CM | POA: Diagnosis present

## 2013-05-06 DIAGNOSIS — G4730 Sleep apnea, unspecified: Secondary | ICD-10-CM | POA: Diagnosis present

## 2013-05-06 DIAGNOSIS — I2510 Atherosclerotic heart disease of native coronary artery without angina pectoris: Secondary | ICD-10-CM | POA: Diagnosis present

## 2013-05-06 DIAGNOSIS — D649 Anemia, unspecified: Secondary | ICD-10-CM | POA: Diagnosis present

## 2013-05-06 DIAGNOSIS — M1712 Unilateral primary osteoarthritis, left knee: Secondary | ICD-10-CM | POA: Diagnosis present

## 2013-05-06 DIAGNOSIS — I10 Essential (primary) hypertension: Secondary | ICD-10-CM | POA: Diagnosis present

## 2013-05-06 LAB — CBC
HCT: 26.9 % — ABNORMAL LOW (ref 36.0–46.0)
MCHC: 33.1 g/dL (ref 30.0–36.0)
MCV: 79.1 fL (ref 78.0–100.0)
RBC: 3.4 MIL/uL — ABNORMAL LOW (ref 3.87–5.11)
WBC: 10.1 10*3/uL (ref 4.0–10.5)

## 2013-05-06 LAB — GLUCOSE, CAPILLARY
Glucose-Capillary: 186 mg/dL — ABNORMAL HIGH (ref 70–99)
Glucose-Capillary: 154 mg/dL — ABNORMAL HIGH (ref 70–99)
Glucose-Capillary: 175 mg/dL — ABNORMAL HIGH (ref 70–99)

## 2013-05-06 LAB — BASIC METABOLIC PANEL
BUN: 19 mg/dL (ref 6–23)
CO2: 30 mEq/L (ref 19–32)
Chloride: 94 mEq/L — ABNORMAL LOW (ref 96–112)
Creatinine, Ser: 0.96 mg/dL (ref 0.50–1.10)
GFR calc Af Amer: 75 mL/min — ABNORMAL LOW (ref >90)
Glucose, Bld: 188 mg/dL — ABNORMAL HIGH (ref 70–99)

## 2013-05-06 MED ORDER — SODIUM CHLORIDE 0.9 % IV BOLUS (SEPSIS): 500.0000 mL | Freq: Once | INTRAVENOUS | Status: DC

## 2013-05-06 MED ORDER — SODIUM CHLORIDE 0.9 % IV BOLUS (SEPSIS)
500.0000 mL | Freq: Once | INTRAVENOUS | Status: AC
  Administered 2013-05-06: 500 mL via INTRAVENOUS

## 2013-05-06 MED ORDER — TRIAMTERENE 50 MG PO CAPS
50.0000 mg | ORAL_CAPSULE | Freq: Every day | ORAL | Status: DC
  Administered 2013-05-06 – 2013-05-08 (×3): 50 mg via ORAL
  Filled 2013-05-06 (×3): qty 1

## 2013-05-06 NOTE — Progress Notes (Signed)
Physical Therapy Treatment Patient Details Name: Abigail Townsend MRN: [id number] DOB: March 24, 1956 Today's Date: 05/06/2013 Time: 8295-6213 PT Time Calculation (min): 34 min  PT Assessment / Plan / Recommendation  History of Present Illness 57 y.o. female admitted to [facility] on 05/05/13 for elective L TKA.  She is WBAT and KI for gait.  She has h/o R TKA ~ 5 years ago and that knee is doing well per pt report.     PT Comments   Pt slowly progressing with mobility and therapy. Requires max encouragement to incr amb distance and participate in theraex. Pt can be self limiting at times. C/o 10/10 pain with activity but is lethargic and cued multiple times to keep eyes open during session. Pt planning to D/C home tomorrow. Will need to address steps prior to D/C.   Follow Up Recommendations  Home health PT;Supervision for mobility/OOB     Does the patient have the potential to tolerate intense rehabilitation     Barriers to Discharge        Equipment Recommendations  None recommended by PT    Recommendations for Other Services    Frequency 7X/week   Progress towards PT Goals Progress towards PT goals: Progressing toward goals  Plan Current plan remains appropriate    Precautions / Restrictions Precautions Precautions: Knee;Fall Required Braces or Orthoses: Knee Immobilizer - Left Knee Immobilizer - Left: On when out of bed or walking Restrictions Weight Bearing Restrictions: No LLE Weight Bearing: Weight bearing as tolerated Other Position/Activity Restrictions: no pillow under left knee   Pertinent Vitals/Pain 10/10; RN notified     Mobility  Bed Mobility Bed Mobility: Supine to Sit;Sitting - Scoot to Edge of Bed Supine to Sit: 5: Supervision;HOB elevated;With rails Sitting - Scoot to Edge of Bed: 5: Supervision;With rail Details for Bed Mobility Assistance: pt requires incr time and demo incr difficulty with bed mobility; no physical (A) needed; pt relied heavily on handrails    Transfers Transfers: Sit to Stand;Stand to Sit Sit to Stand: 4: Min assist;From chair/3-in-1;With armrests;With upper extremity assist Stand to Sit: 4: Min assist;To chair/3-in-1;With armrests;With upper extremity assist Details for Transfer Assistance: min (A) to power up from chair and from bed; max cues for sequencing and hand placement; pt anxious with transfers due to anticipating pain with WB; incr time for transfers  Ambulation/Gait Ambulation/Gait Assistance: 4: Min assist Ambulation Distance (Feet): 30 Feet (x2) Assistive device: Rolling walker Ambulation/Gait Assistance Details: cues for gt sequencing and upright posture; pt requires max encouragement to incr amb; is self limiting; required sitting rest break due to dizziniess; amb on RA and varied from 88%-92% with cues for deep breathing  Gait Pattern: Step-to pattern;Decreased stance time - left;Decreased step length - right;Wide base of support;Trunk flexed Gait velocity: decreased Stairs: No Wheelchair Mobility Wheelchair Mobility: No    Exercises Total Joint Exercises Ankle Circles/Pumps: AROM;10 reps;Both;Supine Quad Sets: AROM;Left;10 reps;Seated;Strengthening Straight Leg Raises: AAROM;Left;5 reps;Supine Long Arc Quad: AROM;Left;10 reps;Seated;AAROM Goniometric ROM: Lt knee flexion 70 degrees AROM limited by pain   PT Diagnosis:    PT Problem List:   PT Treatment Interventions:     PT Goals (current goals can now be found in the care plan section) Acute Rehab PT Goals Patient Stated Goal: to go home PT Goal Formulation: With patient Time For Goal Achievement: 05/12/13 Potential to Achieve Goals: Good  Visit Information  Last PT Received On: 05/06/13 Assistance Needed: +2 (for safety with steps; and to follow with chair) History of Present  Illness: 57 y.o. female admitted to [facility] on 05/05/13 for elective L TKA.  She is WBAT and KI for gait.  She has h/o R TKA ~ 5 years ago and that knee is doing well per pt  report.      Subjective Data  Subjective: Pt lying supine; c/o pain but has eyes closed; agreeable to therapy with max encouragement  Patient Stated Goal: to go home   Cognition  Cognition Arousal/Alertness: Lethargic;Suspect due to medications Behavior During Therapy: Anxious Overall Cognitive Status: Within Functional Limits for tasks assessed    Balance  Balance Balance Assessed: Yes Static Sitting Balance Static Sitting - Balance Support: Bilateral upper extremity supported;Feet supported;Feet unsupported Static Sitting - Level of Assistance: 5: Stand by assistance Static Sitting - Comment/# of Minutes: pt tolerated sitting EOB ~5 min to donn clothes; pt would lose balance posteriorly but recover independently   End of Session PT - End of Session Equipment Utilized During Treatment: Left knee immobilizer;Oxygen Activity Tolerance: Patient tolerated treatment well Patient left: in chair;with call bell/phone within reach Nurse Communication: Mobility status;Patient requests pain meds   GP     Donell Sievert ,  [phone]  05/06/2013, 9:26 AM

## 2013-05-06 NOTE — Progress Notes (Addendum)
Subjective: 1 Day Post-Op Procedure(s) (LRB): TOTAL KNEE ARTHROPLASTY (Left) Patient reports pain as 5 on 0-10 scale.    Objective: Vital signs in last 24 hours: Temp:  [97.6 F (36.4 C)-98.6 F (37 C)] 98.5 F (36.9 C) (10/18 0552) Pulse Rate:  [82-94] 89 (10/18 0552) Resp:  [8-25] 19 (10/18 0552) BP: (110-157)/(61-89) 148/66 mmHg (10/18 0552) SpO2:  [89 %-99 %] 98 % (10/18 0552)  Intake/Output from previous day: 10/17 0701 - 10/18 0700 In: 2280 [P.O.:480; I.V.:1800] Out: 1135 [Urine:960; Drains:75; Blood:100] Intake/Output this shift:     Recent Labs  05/05/13 1413 05/06/13 0645  HGB 10.2* 8.9*    Recent Labs  05/05/13 1413 05/06/13 0645  WBC 18.0* 10.1  RBC 3.88 3.40*  HCT 31.0* 26.9*  PLT 228 180    Recent Labs  05/05/13 1413 05/06/13 0645  NA  --  134*  K  --  4.4  CL  --  94*  CO2  --  30  BUN  --  19  CREATININE 0.86 0.96  GLUCOSE  --  188*  CALCIUM  --  8.3*   No results found for this basename: LABPT, INR,  in the last 72 hours  Neurologically intact ABD soft Sensation intact distally Intact pulses distally Dorsiflexion/Plantar flexion intact Incision: dressing C/D/I  Assessment/Plan: 1 Day Post-Op Procedure(s) (LRB): TOTAL KNEE ARTHROPLASTY (Left) Principal Problem:   Left knee DJD Active Problems:   Impingement syndrome of left shoulder   Arthritis   Insomnia   GERD (gastroesophageal reflux disease)   Hypertension   Diabetes mellitus without complication   PONV (postoperative nausea and vomiting)   Hyperlipidemia   Coronary artery disease   IBS (irritable bowel syndrome)   Sleep apnea   Anemia   S/P total knee arthroplasty  Advance diet Up with therapy D/C IV fluids Plan for discharge tomorrow  Pascal Lux 05/06/2013, 8:42 AM

## 2013-05-06 NOTE — Progress Notes (Signed)
   CARE MANAGEMENT NOTE 05/06/2013  Patient:  CAMERA, KRIENKE   Account Number:  [id number]  Date Initiated:  05/06/2013  Documentation initiated by:  [facility]  Subjective/Objective Assessment:   adm: elective L TKA     Action/Plan:   discharge planning   Anticipated DC Date:  05/07/2013   Anticipated DC Plan:  HOME W HOME HEALTH SERVICES      DC Planning Services  CM consult      [facility] Choice  HOME HEALTH   Choice offered to / List presented to:  C-1 Patient   DME arranged  3-N-1  WALKER - Lottie Mussel      DME agency  [facility]     HH arranged  HH-2 PT      HH agency  [facility]   Status of service:  Completed, signed off Medicare Important Message given?   (If response is "NO", the following Medicare IM given date fields will be blank) Date Medicare IM given:   Date Additional Medicare IM given:    Discharge Disposition:  HOME W HOME HEALTH SERVICES  Per UR Regulation:    If discussed at Long Length of Stay Meetings, dates discussed:    Comments:  05/06/13 09:00 CM spoke with pt in room to offer choice. Pt chooses [facility], [phone].  Referral was called into [facility] and faxed to [phone] to the attention of Filiberto Pinks per [facility] on-call [facility].  Pt's address and contact numbers verified per facesheet.  3n1, trapeze, and rolling walker to be delivered to room prior to discharge.  No other CM concerns were communicated.  Freddy Jaksch, BSN, CM, [phone].

## 2013-05-07 ENCOUNTER — Inpatient Hospital Stay (HOSPITAL_COMMUNITY): Payer: BC Managed Care – PPO

## 2013-05-07 LAB — BASIC METABOLIC PANEL
BUN: 14 mg/dL (ref 6–23)
CO2: 31 mEq/L (ref 19–32)
Chloride: 92 mEq/L — ABNORMAL LOW (ref 96–112)
Creatinine, Ser: 0.85 mg/dL (ref 0.50–1.10)
GFR calc non Af Amer: 75 mL/min — ABNORMAL LOW (ref >90)
Potassium: 4 mEq/L (ref 3.5–5.1)
Sodium: 132 mEq/L — ABNORMAL LOW (ref 135–145)

## 2013-05-07 LAB — CBC
HCT: 24 % — ABNORMAL LOW (ref 36.0–46.0)
MCV: 78.9 fL (ref 78.0–100.0)
Platelets: 153 10*3/uL (ref 150–400)
RBC: 3.04 MIL/uL — ABNORMAL LOW (ref 3.87–5.11)
RDW: 15.6 % — ABNORMAL HIGH (ref 11.5–15.5)
WBC: 9.2 10*3/uL (ref 4.0–10.5)

## 2013-05-07 LAB — PREPARE RBC (CROSSMATCH)

## 2013-05-07 LAB — GLUCOSE, CAPILLARY
Glucose-Capillary: 188 mg/dL — ABNORMAL HIGH (ref 70–99)
Glucose-Capillary: 176 mg/dL — ABNORMAL HIGH (ref 70–99)

## 2013-05-07 MED ORDER — NALOXONE HCL 0.4 MG/ML IJ SOLN
0.4000 mg | INTRAMUSCULAR | Status: DC | PRN
  Administered 2013-05-07: 0.1 mg via INTRAVENOUS

## 2013-05-07 MED ORDER — FUROSEMIDE 10 MG/ML IJ SOLN
20.0000 mg | Freq: Once | INTRAMUSCULAR | Status: AC
  Administered 2013-05-07: 20 mg via INTRAVENOUS
  Filled 2013-05-07: qty 2

## 2013-05-07 MED ORDER — NALOXONE HCL 0.4 MG/ML IJ SOLN
INTRAMUSCULAR | Status: AC
  Administered 2013-05-07: 0.1 mg via INTRAVENOUS
  Filled 2013-05-07: qty 1

## 2013-05-07 NOTE — Progress Notes (Signed)
Administered Tylenol 650mg  PO PRN q6 for low grade fever of 99.5.  Patient was aroused on que, slight lethargic.  Oxygen administered via [place] d/t 02 sats >74-96% on RA.  HOB increased to 25.  PA arrived, new orders given.  Narcan 0.4mg  IV to be administered in 0.1mg  increments over 3-57min.  0.2mg  administered, patient was less lethargic.  Two units of blood to to administered.  Nsg to continue to monitor for status changes.

## 2013-05-07 NOTE — Progress Notes (Signed)
Pt. Has CPAP with her nasal mask from home set up at the bedside. Pt. Stated that she would place herself on CPAP before going to bed. Pt. Was made aware to call RT if she needed assistance with CPAP.

## 2013-05-07 NOTE — Progress Notes (Signed)
OT Cancellation Note  Patient Details Name: Abigail Townsend MRN: [id number] DOB: 06/28/1956   Cancelled Treatment:    Reason Eval/Treat Not Completed: Medical issues which prohibited therapy.  PA requesting to hold therapy this AM.  Pt to receive 2 units RBCs.  Will re-attempt this afternoon.  05/07/2013 Cipriano Mile OTR/L Pager [phone] Office [phone]

## 2013-05-07 NOTE — Progress Notes (Signed)
PT Cancellation Note  Patient Details Name: Abigail Townsend MRN: [id number] DOB: [birth year]   Cancelled Treatment:     Session cancelled due to pt very lethargic & Hgb 7.9.  PA requesting to attempt back in afternoon.      Verdell Face, [place] [phone] 05/07/2013

## 2013-05-07 NOTE — Progress Notes (Signed)
Subjective: 2 Days Post-Op Procedure(s) (LRB): TOTAL KNEE ARTHROPLASTY (Left) Patient reports pain as 5 on 0-10 scale.   Pt is very somelent and unable to maintain sats off of O2.  She will open her on on request.  She is not moving.  Right now she is not safe to get up with Physical Therapy Objective: Vital signs in last 24 hours: Temp:  [98.4 F (36.9 C)-99.3 F (37.4 C)] 99.3 F (37.4 C) (10/19 0827) Pulse Rate:  [99-107] 102 (10/19 0827) Resp:  [18] 18 (10/19 0827) BP: (110-141)/(53-64) 110/64 mmHg (10/19 0827) SpO2:  [93 %-96 %] 94 % (10/19 0827)  Intake/Output from previous day: 10/18 0701 - 10/19 0700 In: 365 [P.O.:365] Out: -  Intake/Output this shift:     Recent Labs  05/05/13 1413 05/06/13 0645 05/07/13 0403  HGB 10.2* 8.9* 7.9*    Recent Labs  05/06/13 0645 05/07/13 0403  WBC 10.1 9.2  RBC 3.40* 3.04*  HCT 26.9* 24.0*  PLT 180 153    Recent Labs  05/06/13 0645 05/07/13 0403  NA 134* 132*  K 4.4 4.0  CL 94* 92*  CO2 30 31  BUN 19 14  CREATININE 0.96 0.85  GLUCOSE 188* 193*  CALCIUM 8.3* 8.6   No results found for this basename: LABPT, INR,  in the last 72 hours  Neurovascular intact Sensation intact distally Intact pulses distally Incision: scant drainage Very somnolent and lethargic.  Difficult to arouse Assessment/Plan: 2 Days Post-Op Procedure(s) (LRB): TOTAL KNEE ARTHROPLASTY (Left) Advance diet Will hold therapy this am. Will transfuse 2 units of PRBC Will give narcan Will order chest xray stat  Bryelle Spiewak J 05/07/2013, 9:22 AM

## 2013-05-07 NOTE — Progress Notes (Signed)
OT Cancellation Note  Patient Details Name: Abigail Townsend MRN: [id number] DOB: June 12, 1956   Cancelled Treatment:    Reason Eval/Treat Not Completed: Medical issues which prohibited therapy. Per RN request, holding OT session this afternoon.  Pt awaiting 2nd unit RBC transfusion and IV placement not stable per RN. Will f/u tomorrow.  05/07/2013 Cipriano Mile OTR/L Pager [phone] Office [phone]

## 2013-05-07 NOTE — Progress Notes (Signed)
PT Cancellation Note  Patient Details Name: KEYRI SALBERG MRN: [id number] DOB: Jan 12, 1956   Cancelled Treatment:     PM session held per RN's request.  Awaiting 2nd unit RBC transfusion.   Will f/u tomorrow.    Verdell Face, [place] [phone] 05/07/2013

## 2013-05-07 NOTE — Progress Notes (Signed)
First unit of blood administered per MD order, no change in pt condition.  Will arouse on que, transferred to [facility] and tolerated well, ate 100% of lunch.  Second unit of blood now administering with slight delay d/t high acuity of two other patients on unit.  Patient tolerating transfusion well.  Nsg to continue to monitor for status changes.

## 2013-05-08 ENCOUNTER — Encounter (HOSPITAL_COMMUNITY): Payer: Self-pay | Admitting: General Practice

## 2013-05-08 LAB — TYPE AND SCREEN
Antibody Screen: NEGATIVE
Unit division: 0

## 2013-05-08 LAB — BASIC METABOLIC PANEL
BUN: 13 mg/dL (ref 6–23)
CO2: 35 mEq/L — ABNORMAL HIGH (ref 19–32)
Calcium: 9.3 mg/dL (ref 8.4–10.5)
Creatinine, Ser: 0.76 mg/dL (ref 0.50–1.10)
GFR calc Af Amer: >90 mL/min (ref >90)
GFR calc non Af Amer: >90 mL/min (ref >90)
Sodium: 135 mEq/L (ref 135–145)

## 2013-05-08 LAB — CBC
HCT: 28.4 % — ABNORMAL LOW (ref 36.0–46.0)
MCH: 25.5 pg — ABNORMAL LOW (ref 26.0–34.0)
MCHC: 32.7 g/dL (ref 30.0–36.0)
MCV: 78 fL (ref 78.0–100.0)
Platelets: 144 10*3/uL — ABNORMAL LOW (ref 150–400)
RDW: 16.2 % — ABNORMAL HIGH (ref 11.5–15.5)

## 2013-05-08 LAB — GLUCOSE, CAPILLARY

## 2013-05-08 MED ORDER — ENOXAPARIN SODIUM 30 MG/0.3ML ~~LOC~~ SOLN: 30.0000 mg | Freq: Two times a day (BID) | SUBCUTANEOUS | Status: DC

## 2013-05-08 NOTE — Progress Notes (Signed)
Physical Therapy Treatment Patient Details Name: Abigail Townsend MRN: [id number] DOB: Jul 22, 1955 Today's Date: 05/08/2013 Time: 4098-1191 PT Time Calculation (min): 25 min  PT Assessment / Plan / Recommendation  History of Present Illness 57 y.o. female admitted to [facility] on 05/05/13 for elective L TKA.  She is WBAT and KI for gait.  She has h/o R TKA ~ 5 years ago and that knee is doing well per pt report.     PT Comments   Pt cont's to require encouragement to increase amb distance.  Fatigues quickly.  Overall moving well though.    Follow Up Recommendations  Home health PT;Supervision for mobility/OOB     Does the patient have the potential to tolerate intense rehabilitation     Barriers to Discharge        Equipment Recommendations  None recommended by PT    Recommendations for Other Services    Frequency 7X/week   Progress towards PT Goals Progress towards PT goals: Progressing toward goals  Plan Current plan remains appropriate    Precautions / Restrictions Precautions Precautions: Knee;Fall Required Braces or Orthoses: Knee Immobilizer - Left Knee Immobilizer - Left: On when out of bed or walking Restrictions LLE Weight Bearing: Weight bearing as tolerated Other Position/Activity Restrictions: no pillow under left knee   Pertinent Vitals/Pain C/o Lt knee pain.  Did not rate.  Repositioned for comfort.  Premedicated.     Mobility  Bed Mobility Bed Mobility: Supine to Sit;Sitting - Scoot to [place] of Bed;Sit to Supine Supine to Sit: 4: Min assist Sitting - Scoot to Edge of Bed: 5: Supervision Sit to Supine: 4: Min assist Details for Bed Mobility Assistance: Effortful for pt due to body habitus.  cues for increased use of UE's to increase ease of transitional movements.   Transfers Transfers: Sit to Stand;Stand to Sit Sit to Stand: 4: Min guard;With upper extremity assist;From bed Stand to Sit: 4: Min guard;With upper extremity assist;To bed Details for Transfer  Assistance: Pt able to demonstrate safe hand placement & technique.   Ambulation/Gait Ambulation/Gait Assistance: 4: Min guard Ambulation Distance (Feet): 100 Feet Assistive device: Rolling walker Ambulation/Gait Assistance Details: Max encouragement to increase distance.  Pt wanting to return back to bed after ~10'.   Gait Pattern: Step-to pattern;Decreased weight shift to left;Decreased step length - right Gait velocity: decreased General Gait Details: Pt self-limiting.  Slow but steady & able to demonstrate proper sequencing with less cueing this session Stairs: No Stairs Assistance: 4: Min guard Stairs Assistance Details (indicate cue type and reason): cues for sequencing & safe RW management.  Practiced forwards & backwards technique.   Stair Management Technique: No rails;Step to pattern;Backwards;Forwards;With walker Number of Stairs: 1 (2x's) Wheelchair Mobility Wheelchair Mobility: No    Exercises Total Joint Exercises Ankle Circles/Pumps: AROM;Both;10 reps Quad Sets: AROM;Both;10 reps Heel Slides: AAROM;Left;10 reps;Supine Straight Leg Raises: AAROM;Left;10 reps;Supine Long Arc Quad: AROM;Left;10 reps;Seated Knee Flexion: AAROM;Left;5 reps;Limitations Knee Flexion Limitations: Limited by pain     PT Goals (current goals can now be found in the care plan section) Acute Rehab PT Goals PT Goal Formulation: With patient Time For Goal Achievement: 05/12/13 Potential to Achieve Goals: Good  Visit Information  Last PT Received On: 05/08/13 Assistance Needed: +1 History of Present Illness: 57 y.o. female admitted to [facility] on 05/05/13 for elective L TKA.  She is WBAT and KI for gait.  She has h/o R TKA ~ 5 years ago and that knee is doing well per pt report.  Subjective Data      Cognition  Cognition Arousal/Alertness: Awake/alert Behavior During Therapy: WFL for tasks assessed/performed Overall Cognitive Status: Within Functional Limits for tasks assessed Memory:  Decreased short-term memory (for ambulation & stairs)    Balance     End of Session PT - End of Session Equipment Utilized During Treatment: Gait belt;Left knee immobilizer Activity Tolerance: Patient tolerated treatment well;Patient limited by fatigue Patient left: in bed;with call bell/phone within reach Nurse Communication: Mobility status   GP     Lara Mulch 05/08/2013, 1:59 PM   Verdell Face, PTA [phone] 05/08/2013

## 2013-05-08 NOTE — Progress Notes (Signed)
Subjective: 3 Days Post-Op Procedure(s) (LRB): TOTAL KNEE ARTHROPLASTY (Left) Patient reports pain as moderate.    Objective: Vital signs in last 24 hours: Temp:  [97.9 F (36.6 C)-99 F (37.2 C)] 98.1 F (36.7 C) (10/20 0619) Pulse Rate:  [89-98] 94 (10/20 0619) Resp:  [18] 18 (10/20 0619) BP: (100-139)/(59-74) 139/66 mmHg (10/20 0619) SpO2:  [91 %-100 %] 91 % (10/20 0920)  Intake/Output from previous day: 10/19 0701 - 10/20 0700 In: 1860 [P.O.:720; I.V.:500; Blood:640] Out: 2150 [Urine:2150] Intake/Output this shift: Total I/O In: 360 [P.O.:360] Out: 100 [Urine:100]   Recent Labs  05/05/13 1413 05/06/13 0645 05/07/13 0403 05/08/13 0510  HGB 10.2* 8.9* 7.9* 9.3*    Recent Labs  05/07/13 0403 05/08/13 0510  WBC 9.2 8.6  RBC 3.04* 3.64*  HCT 24.0* 28.4*  PLT 153 144*    Recent Labs  05/07/13 0403 05/08/13 0510  NA 132* 135  K 4.0 4.2  CL 92* 95*  CO2 31 35*  BUN 14 13  CREATININE 0.85 0.76  GLUCOSE 193* 169*  CALCIUM 8.6 9.3   No results found for this basename: LABPT, INR,  in the last 72 hours  Neurovascular intact Sensation intact distally Intact pulses distally Dorsiflexion/Plantar flexion intact Incision: dressing C/D/I and scant drainage No cellulitis present Compartment soft  Assessment/Plan: 3 Days Post-Op Procedure(s) (LRB): TOTAL KNEE ARTHROPLASTY (Left) Up with therapy Discharge home with home health  Abigail Townsend 05/08/2013, 1:03 PM

## 2013-05-08 NOTE — Progress Notes (Signed)
Orthopedic Tech Progress Note Patient Details:  TRENA DUNAVAN [birth year] [id number]  Patient ID: [name], female   DOB: 26-Mar-1956, 57 y.o.   MRN: [id number] Placed pt's lle in cpm @ 0-60 degrees@ 1500  Nikki Dom 05/08/2013, 2:59 PM

## 2013-05-08 NOTE — Evaluation (Signed)
Occupational Therapy Evaluation and Discharge Patient Details Name: Abigail Townsend MRN: [id number] DOB: Nov 21, 1955 Today's Date: 05/08/2013 Time: 4696-2952 OT Time Calculation (min): 28 min  OT Assessment / Plan / Recommendation History of present illness 57 y.o. female admitted to [facility] on 05/05/13 for elective L TKA.  She is WBAT and KI for gait.  She has h/o R TKA ~ 5 years ago and that knee is doing well per pt report.     Clinical Impression   This 57 yo female s/p above presents to acute OT with all education completed. No further OT needs, will sign off.    OT Assessment  Patient does not need any further OT services    Follow Up Recommendations  No OT follow up       Equipment Recommendations  None recommended by OT          Precautions / Restrictions Precautions Precautions: Knee;Fall Required Braces or Orthoses: Knee Immobilizer - Left Knee Immobilizer - Left: On when out of bed or walking Restrictions Weight Bearing Restrictions: Yes LLE Weight Bearing: Weight bearing as tolerated Other Position/Activity Restrictions: no pillow under left knee   Pertinent Vitals/Pain 8/10 LLE; pre-medicated and repositioned   ADL  Eating/Feeding: Independent Where Assessed - Eating/Feeding: Chair Grooming: Min guard Where Assessed - Grooming: Unsupported standing Upper Body Bathing: Set up Where Assessed - Upper Body Bathing: Unsupported sitting Lower Body Bathing: Moderate assistance Where Assessed - Lower Body Bathing: Unsupported sit to stand Upper Body Dressing: Set up Where Assessed - Upper Body Dressing: Unsupported sitting Lower Body Dressing: Maximal assistance Where Assessed - Lower Body Dressing: Unsupported sit to stand Toilet Transfer: Min guard Toilet Transfer Method: Sit to Barista: Regular height toilet;Grab bars Toileting - Architect and Hygiene: Min guard Where Assessed - Engineer, mining and Hygiene:  Standing Tub/Shower Transfer: Minimal assistance (LLE) Tub/Shower Transfer Method: Science writer: Counsellor Used: Gait belt;Rolling walker;Knee Immobilizer Transfers/Ambulation Related to ADLs: min guard A for all ADL Comments: Husband will A with LB ADLS until pt can do them for herself. Pt will gt tub bench on her own          Visit Information  Last OT Received On: 05/08/13 Assistance Needed: +1 History of Present Illness: 57 y.o. female admitted to [facility] on 05/05/13 for elective L TKA.  She is WBAT and KI for gait.  She has h/o R TKA ~ 5 years ago and that knee is doing well per pt report.         Prior Functioning     Home Living Family/patient expects to be discharged to:: Private residence Living Arrangements: Spouse/significant other Available Help at Discharge: Family;Available 24 hours/day Type of Home: House Home Access: Level entry Home Layout: One level;Laundry or work area in [employer] Equipment: Environmental consultant - 2 wheels;Bedside commode Additional Comments: pt reports she needs a shower seat--pt to get one on her own Prior Function Level of Independence: Independent Comments: works as a [employer] with a 62 y.o. boy.   Communication Communication: No difficulties Dominant Hand: Right         Vision/Perception Vision - History Patient Visual Report: No change from baseline   Cognition  Cognition Arousal/Alertness: Awake/alert Behavior During Therapy: WFL for tasks assessed/performed Overall Cognitive Status: Within Functional Limits for tasks assessed Memory: Decreased short-term memory (for sequencing of ambulation and stairs)    Extremity/Trunk Assessment Upper Extremity Assessment Upper Extremity Assessment: Overall WFL for tasks assessed  Mobility Bed Mobility Bed Mobility: Supine to Sit;Sitting - Scoot to Edge of Bed Supine to Sit: 4: Min assist;HOB flat;With rails Sitting - Scoot to Edge of Bed: 5:  Supervision Details for Bed Mobility Assistance: pt requires incr time and demo incr difficulty with bed mobility; pt relied heavily on handrails   Transfers Transfers: Sit to Stand;Stand to Sit Sit to Stand: 4: Min guard;With upper extremity assist;From bed Stand to Sit: 4: Min guard;With upper extremity assist;With armrests;To chair/3-in-1 Details for Transfer Assistance: VCs for safe hand placement and for LLE positioning           End of Session OT - End of Session Equipment Utilized During Treatment: Gait belt;Rolling walker;Left knee immobilizer Activity Tolerance: Patient tolerated treatment well Patient left: in chair;with call bell/phone within reach       Evette Georges [phone] 05/08/2013, 10:39 AM

## 2013-05-08 NOTE — Progress Notes (Signed)
Physical Therapy Treatment Patient Details Name: Abigail Townsend MRN: [id number] DOB: February 07, 1956 Today's Date: 05/08/2013 Time:  -     PT Assessment / Plan / Recommendation  History of Present Illness 56 y.o. female admitted to [facility] on 05/05/13 for elective L TKA.  She is WBAT and KI for gait.  She has h/o R TKA ~ 5 years ago and that knee is doing well per pt report.     PT Comments   Pt progressing with PT goals & mobility at this date.  Able to increase ambulation distance & perform 1 step as she has at home.     Follow Up Recommendations  Home health PT;Supervision for mobility/OOB     Does the patient have the potential to tolerate intense rehabilitation     Barriers to Discharge        Equipment Recommendations  None recommended by PT    Recommendations for Other Services    Frequency 7X/week   Progress towards PT Goals Progress towards PT goals: Progressing toward goals  Plan Current plan remains appropriate    Precautions / Restrictions Precautions Precautions: Knee;Fall Required Braces or Orthoses: Knee Immobilizer - Left Knee Immobilizer - Left: On when out of bed or walking Restrictions LLE Weight Bearing: Weight bearing as tolerated Other Position/Activity Restrictions: no pillow under left knee   Pertinent Vitals/Pain 8/10 Lt knee with WBing.  Premedicated.  Repositioned for comfort.  Pt does c/o feeling "woozy" & states she doesn't feel well with ambulation but quickly resolves with seated rest break    Mobility  Bed Mobility Bed Mobility: Supine to Sit;Sitting - Scoot to Edge of Bed Supine to Sit: 4: Min assist;HOB flat;With rails Sitting - Scoot to Edge of Bed: 5: Supervision Details for Bed Mobility Assistance: (A) for LLE management & to bring shoulders/trunk to sitting upright.  Incr time Transfers Transfers: Sit to Stand;Stand to Sit Sit to Stand: 4: Min guard;With upper extremity assist;With armrests;From bed;From chair/3-in-1 Stand to Sit: 4: Min  guard;With upper extremity assist;With armrests;To chair/3-in-1 Details for Transfer Assistance: cues for hand placement & LLE positioning before sitting Ambulation/Gait Ambulation/Gait Assistance: 4: Min guard Ambulation Distance (Feet): 100 Feet Assistive device: Rolling walker Ambulation/Gait Assistance Details: cues for sequencing & safe RW advancement.  Pt c/o increased pain with WBing.   Pt seems to be self-limiting requiring max encouragement to increase distance.   Gait Pattern: Step-to pattern;Decreased weight shift to left;Decreased step length - right Gait velocity: decreased Stairs: Yes Stairs Assistance: 4: Min guard Stairs Assistance Details (indicate cue type and reason): cues for sequencing & safe RW management.  Practiced forwards & backwards technique.   Stair Management Technique: No rails;Step to pattern;Backwards;Forwards;With walker Number of Stairs: 1 (2x's) Wheelchair Mobility Wheelchair Mobility: No    Exercises Total Joint Exercises Ankle Circles/Pumps: AROM;Both;10 reps Straight Leg Raises: AAROM;Left;10 reps;Supine     PT Goals (current goals can now be found in the care plan section) Acute Rehab PT Goals PT Goal Formulation: With patient Time For Goal Achievement: 05/12/13 Potential to Achieve Goals: Good  Visit Information  Last PT Received On: 05/08/13 Assistance Needed: +1 History of Present Illness: 57 y.o. female admitted to [facility] on 05/05/13 for elective L TKA.  She is WBAT and KI for gait.  She has h/o R TKA ~ 5 years ago and that knee is doing well per pt report.      Subjective Data      Cognition  Cognition Arousal/Alertness: Awake/alert Behavior During Therapy:  WFL for tasks assessed/performed Overall Cognitive Status: Within Functional Limits for tasks assessed Memory: Decreased short-term memory (for ambulation & stairs)    Balance     End of Session PT - End of Session Equipment Utilized During Treatment: Gait belt;Left knee  immobilizer Activity Tolerance: Patient tolerated treatment well;Patient limited by fatigue Patient left: in chair;with call bell/phone within reach Nurse Communication: Mobility status   GP     Lara Mulch 05/08/2013, 1:23 PM  Verdell Face, PTA [phone] 05/08/2013

## 2013-05-08 NOTE — Progress Notes (Signed)
05/08/13 Contacted [facility], spoke with Jola Babinski, patient is set up with HHPT.She requested order and d/c summary be faxed to [phone].Faxed requested info and received confirmation. Patient stated that she already has rolling walker and 3N1 from previous surgery. No equipment needs identified. Jacquelynn Cree RN, BSN, CCM     05/06/13 09:00 CM spoke with pt in room to offer choice. Pt chooses [facility], [phone].  Referral was called into [facility] and faxed to [phone] to the attention of Filiberto Pinks per [facility] on-call [facility].  Pt's address and contact numbers verified per facesheet.  3n1, trapeze, and rolling walker to be delivered to room prior to discharge.  No other CM concerns were communicated.  Freddy Jaksch, BSN, CM, [phone].

## 2013-05-08 NOTE — Discharge Summary (Signed)
PATIENT ID: Abigail Townsend        MRN:  [id number]          DOB/AGE: 04-02-1956 / 57 y.o.    DISCHARGE SUMMARY  ADMISSION DATE:    05/05/2013 DISCHARGE DATE:   05/08/2013   ADMISSION DIAGNOSIS: OA LEFT KNEE    DISCHARGE DIAGNOSIS:  OA LEFT KNEE    ADDITIONAL DIAGNOSIS: Principal Problem:   Left knee DJD Active Problems:   Impingement syndrome of left shoulder   Arthritis   Insomnia   GERD (gastroesophageal reflux disease)   Hypertension   Diabetes mellitus without complication   PONV (postoperative nausea and vomiting)   Hyperlipidemia   Coronary artery disease   IBS (irritable bowel syndrome)   Sleep apnea   Anemia   S/P total knee arthroplasty  Past Medical History  Diagnosis Date  . Heart murmur     since birth   . Arthritis     osteo rt knee   . Asthma     Albuterol Prn;takes Sindulair daily and uses Symbicort daily  . Insomnia     takes Elavil nightly and Ambien nightly  . GERD (gastroesophageal reflux disease)     takes Protonix daily  . Hypertension     takes Enalapril and Metoprolol daily as well as Maxzide   . Diabetes mellitus without complication     takes Metformin and Januvia daily  . PONV (postoperative nausea and vomiting)   . Hyperlipidemia     takes Crestor nightly  . Coronary artery disease   . Headache(784.0)     takes Maxalt daily prn and Lyrica daily  . Joint pain   . Joint swelling   . Bruises easily     d/t being on Effient and baby ASA--cardiologist told pt not to stop these  . IBS (irritable bowel syndrome)     amitiza bid  . Constipation   . History of kidney stones     passed on her on many yrs ago  . Anemia     hx of  . Sleep apnea     sleep study done a yr ago-to request  . Apnea, sleep     dr Perley Jain [phone]  . Left knee DJD   . S/P total knee arthroplasty     right    PROCEDURE: Procedure(s): TOTAL KNEE ARTHROPLASTY Left on 05/05/2013  CONSULTS: none     HISTORY:  See H&P in chart  HOSPITAL COURSE:   Abigail Townsend is a 57 y.o. admitted on 05/05/2013 and found to have a diagnosis of OA LEFT KNEE.  After appropriate laboratory studies were obtained  they were taken to the operating room on 05/05/2013 and underwent  Procedure(s): TOTAL KNEE ARTHROPLASTY  Left.   They were given perioperative antibiotics:  Anti-infectives   Start     Dose/Rate Route Frequency Ordered Stop   05/05/13 1400  ceFAZolin (ANCEF) IVPB 2 g/50 mL premix     2 g 100 mL/hr over 30 Minutes Intravenous Every 6 hours 05/05/13 1249 05/05/13 2033   05/05/13 0600  ceFAZolin (ANCEF) 3 g in dextrose 5 % 50 mL IVPB     3 g 160 mL/hr over 30 Minutes Intravenous On call to O.R. 05/04/13 1411 05/05/13 0800    .  Tolerated the procedure well.  Placed with a foley intraoperatively.     POD #1, allowed out of bed to a chair.  PT for ambulation and exercise program.  Foley D/C'd in morning.  IV saline locked.  O2 discontionued.  Pt lethargic given narcan also with low Hgb transfused 2 units PRBC.  POD #2, continued PT and ambulation.   Hemovac pulled. .  The remainder of the hospital course was dedicated to ambulation and strengthening.   The patient was discharged on 3 Days Post-Op in  Stable condition.  Blood products given:2 units PRBC  DIAGNOSTIC STUDIES: Recent vital signs: Patient Vitals for the past 24 hrs:  BP Temp Temp src Pulse Resp SpO2  05/08/13 0920 - - - - - 91 %  05/08/13 0619 139/66 mmHg 98.1 F (36.7 C) - 94 18 96 %  05/07/13 2005 137/59 mmHg 99 F (37.2 C) - 98 18 95 %  05/07/13 1626 122/68 mmHg 99 F (37.2 C) Oral 95 18 99 %  05/07/13 1612 124/64 mmHg 98.2 F (36.8 C) Oral 93 18 100 %  05/07/13 1512 128/64 mmHg 98.2 F (36.8 C) Oral 92 18 100 %  05/07/13 1412 100/60 mmHg 97.9 F (36.6 C) Oral 89 18 99 %  05/07/13 1357 112/74 mmHg 97.9 F (36.6 C) Oral 95 18 100 %       Recent laboratory studies:  Recent Labs  05/05/13 1413 05/06/13 0645 05/07/13 0403 05/08/13 0510  WBC 18.0* 10.1 9.2  8.6  HGB 10.2* 8.9* 7.9* 9.3*  HCT 31.0* 26.9* 24.0* 28.4*  PLT 228 180 153 144*    Recent Labs  05/05/13 1413 05/06/13 0645 05/07/13 0403 05/08/13 0510  NA  --  134* 132* 135  K  --  4.4 4.0 4.2  CL  --  94* 92* 95*  CO2  --  30 31 35*  BUN  --  19 14 13   CREATININE 0.86 0.96 0.85 0.76  GLUCOSE  --  188* 193* 169*  CALCIUM  --  8.3* 8.6 9.3   Lab Results  Component Value Date   INR 0.93 04/26/2013   INR 0.93 07/10/2009   INR 0.9 05/14/2008     Recent Radiographic Studies :  Dg Chest Port 1v Same Day  05/07/2013   CLINICAL DATA:  Low oxygen saturation  EXAM: PORTABLE CHEST - 1 VIEW SAME DAY  COMPARISON:  Prior chest x-ray 06/05/2011  FINDINGS: Stable enlargement of the cardiopericardial silhouette. Atherosclerotic calcifications noted in the transverse aorta. Mild pulmonary vascular congestion without overt edema. Is no focal airspace consolidation, pneumothorax or pleural effusion. No acute osseous abnormality.  IMPRESSION: Cardiomegaly and pulmonary vascular congestion without overt edema.  Aortic atherosclerosis.   Electronically Signed   By: Malachy Moan M.D.   On: 05/07/2013 10:03    DISCHARGE INSTRUCTIONS:    DISCHARGE MEDICATIONS:     Medication List    STOP taking these medications       estradiol 0.025 mg/24hr patch  Commonly known as:  CLIMARA - Dosed in mg/24 hr     prasugrel 10 MG Tabs tablet  Commonly known as:  EFFIENT      TAKE these medications       acetaminophen 500 MG tablet  Commonly known as:  TYLENOL  Take 1,000 mg by mouth 2 (two) times daily as needed for pain.     albuterol 108 (90 BASE) MCG/ACT inhaler  Commonly known as:  PROVENTIL HFA;VENTOLIN HFA  Inhale 2 puffs into the lungs every 6 (six) hours as needed for wheezing or shortness of breath.     amitriptyline 25 MG tablet  Commonly known as:  ELAVIL  Take 25 mg by mouth at bedtime.  aspirin EC 81 MG tablet  Take 81 mg by mouth daily.     budesonide-formoterol  160-4.5 MCG/ACT inhaler  Commonly known as:  SYMBICORT  Inhale 2 puffs into the lungs daily.     CITRACAL + D PO  Take 1,000 mg by mouth daily.     enalapril 5 MG tablet  Commonly known as:  VASOTEC  Take 5 mg by mouth daily.     enoxaparin 30 MG/0.3ML injection  Commonly known as:  LOVENOX  Inject 0.3 mLs (30 mg total) into the skin every 12 (twelve) hours.     Fish Oil 1000 MG Caps  Take 2,000 mg by mouth 2 (two) times daily.     metFORMIN 1000 MG tablet  Commonly known as:  GLUCOPHAGE  Take 1,000 mg by mouth 2 (two) times daily with a meal.     metoprolol succinate 25 MG 24 hr tablet  Commonly known as:  TOPROL-XL  Take 25 mg by mouth daily.     montelukast 10 MG tablet  Commonly known as:  SINGULAIR  Take 10 mg by mouth daily.     multivitamin with minerals Tabs tablet  Take 1 tablet by mouth daily.     NASACORT AQ 55 MCG/ACT nasal inhaler  Generic drug:  triamcinolone  Place 2 sprays into the nose at bedtime.     nitroGLYCERIN 0.4 MG SL tablet  Commonly known as:  NITROSTAT  Place 0.4 mg under the tongue every 5 (five) minutes as needed for chest pain.     oxyCODONE 5 MG immediate release tablet  Commonly known as:  ROXICODONE  1-2 tabs po q4-6hrs prn pain     pantoprazole 40 MG tablet  Commonly known as:  PROTONIX  Take 40 mg by mouth daily.     pregabalin 150 MG capsule  Commonly known as:  LYRICA  Take 150-300 mg by mouth 2 (two) times daily. Take 1 capsule (150 m) every morning and 2 capsules (300 mg) at bedtime     rizatriptan 10 MG disintegrating tablet  Commonly known as:  MAXALT-MLT  Take 10 mg by mouth 2 (two) times daily as needed for migraine. May repeat in 2 hours if needed     rosuvastatin 40 MG tablet  Commonly known as:  CRESTOR  Take 40 mg by mouth at bedtime.     sitaGLIPtin 100 MG tablet  Commonly known as:  JANUVIA  Take 100 mg by mouth daily.     triamterene-hydrochlorothiazide 37.5-25 MG per tablet  Commonly known as:   MAXZIDE-25  Take 1 tablet by mouth daily.     Vitamin D 2000 UNITS tablet  Take 2,000 Units by mouth daily.     zolpidem 10 MG tablet  Commonly known as:  AMBIEN  Take 10 mg by mouth at bedtime.        FOLLOW UP VISIT:       Follow-up Information   Follow up with CAFFREY JR,W D, MD. Schedule an appointment as soon as possible for a visit in 2 weeks.   Specialty:  Orthopedic Surgery   Contact information:   [address] [place] [address] [phone]       Follow up with [facility]. (home health physical therapy)    Contact information:   [phone]      DISPOSITION:   Home HHPT  Patient is to hold effient and estradiol patches for 2 weeks, spoke with Dr. Rondel Baton office cardiologist today they  recommend 30mg  bid lovenox and 81mg  ASA daily, restart effient once completed.    CONDITION:  Stable   Sabree Nuon 05/08/2013, 1:05 PM

## 2013-07-20 DIAGNOSIS — I219 Acute myocardial infarction, unspecified: Secondary | ICD-10-CM

## 2013-07-20 HISTORY — DX: Acute myocardial infarction, unspecified: I21.9

## 2020-01-17 ENCOUNTER — Emergency Department (HOSPITAL_COMMUNITY)
Admission: EM | Admit: 2020-01-17 | Discharge: 2020-01-17 | Disposition: A | Discharge status: Home / Self Care | Payer: BLUE CROSS/BLUE SHIELD | Attending: Emergency Medicine | Admitting: Emergency Medicine

## 2020-01-17 ENCOUNTER — Encounter (HOSPITAL_COMMUNITY): Payer: Self-pay

## 2020-01-17 ENCOUNTER — Emergency Department (HOSPITAL_COMMUNITY): Payer: BLUE CROSS/BLUE SHIELD

## 2020-01-17 ENCOUNTER — Other Ambulatory Visit: Payer: Self-pay

## 2020-01-17 DIAGNOSIS — J45909 Unspecified asthma, uncomplicated: Secondary | ICD-10-CM | POA: Insufficient documentation

## 2020-01-17 DIAGNOSIS — I2510 Atherosclerotic heart disease of native coronary artery without angina pectoris: Secondary | ICD-10-CM | POA: Diagnosis not present

## 2020-01-17 DIAGNOSIS — E119 Type 2 diabetes mellitus without complications: Secondary | ICD-10-CM | POA: Insufficient documentation

## 2020-01-17 DIAGNOSIS — Z87891 Personal history of nicotine dependence: Secondary | ICD-10-CM | POA: Diagnosis not present

## 2020-01-17 DIAGNOSIS — R110 Nausea: Secondary | ICD-10-CM | POA: Diagnosis not present

## 2020-01-17 DIAGNOSIS — I10 Essential (primary) hypertension: Secondary | ICD-10-CM | POA: Insufficient documentation

## 2020-01-17 DIAGNOSIS — G4459 Other complicated headache syndrome: Secondary | ICD-10-CM | POA: Insufficient documentation

## 2020-01-17 DIAGNOSIS — Z96652 Presence of left artificial knee joint: Secondary | ICD-10-CM | POA: Diagnosis not present

## 2020-01-17 DIAGNOSIS — G43109 Migraine with aura, not intractable, without status migrainosus: Secondary | ICD-10-CM

## 2020-01-17 DIAGNOSIS — H53149 Visual discomfort, unspecified: Secondary | ICD-10-CM | POA: Insufficient documentation

## 2020-01-17 LAB — BASIC METABOLIC PANEL
Anion gap: 10 (ref 5–15)
BUN: 16 mg/dL (ref 8–23)
CO2: 26 mmol/L (ref 22–32)
Calcium: 9.1 mg/dL (ref 8.9–10.3)
Chloride: 104 mmol/L (ref 98–111)
Creatinine, Ser: 0.74 mg/dL (ref 0.44–1.00)
GFR calc Af Amer: >60 mL/min (ref >60)
GFR calc non Af Amer: >60 mL/min (ref >60)
Glucose, Bld: 147 mg/dL — ABNORMAL HIGH (ref 70–99)
Potassium: 3.2 mmol/L — ABNORMAL LOW (ref 3.5–5.1)
Sodium: 140 mmol/L (ref 135–145)

## 2020-01-17 LAB — CBC WITH DIFFERENTIAL/PLATELET
Abs Immature Granulocytes: 0.03 10*3/uL (ref 0.00–0.07)
Basophils Absolute: 0 10*3/uL (ref 0.0–0.1)
Basophils Relative: 1 %
Eosinophils Absolute: 0.3 10*3/uL (ref 0.0–0.5)
Eosinophils Relative: 3 %
HCT: 42.2 % (ref 36.0–46.0)
Hemoglobin: 13.3 g/dL (ref 12.0–15.0)
Immature Granulocytes: 0 %
Lymphocytes Relative: 24 %
Lymphs Abs: 2 10*3/uL (ref 0.7–4.0)
MCH: 26.7 pg (ref 26.0–34.0)
MCHC: 31.5 g/dL (ref 30.0–36.0)
MCV: 84.7 fL (ref 80.0–100.0)
Monocytes Absolute: 0.4 10*3/uL (ref 0.1–1.0)
Monocytes Relative: 4 %
Neutro Abs: 5.7 10*3/uL (ref 1.7–7.7)
Neutrophils Relative %: 68 %
Platelets: 179 10*3/uL (ref 150–400)
RBC: 4.98 MIL/uL (ref 3.87–5.11)
RDW: 13.3 % (ref 11.5–15.5)
WBC: 8.4 10*3/uL (ref 4.0–10.5)
nRBC: 0 % (ref 0.0–0.2)

## 2020-01-17 LAB — I-STAT CHEM 8, ED
BUN: 15 mg/dL (ref 8–23)
Calcium, Ion: 1.2 mmol/L (ref 1.15–1.40)
Chloride: 104 mmol/L (ref 98–111)
Creatinine, Ser: 0.7 mg/dL (ref 0.44–1.00)
Glucose, Bld: 143 mg/dL — ABNORMAL HIGH (ref 70–99)
HCT: 39 % (ref 36.0–46.0)
Hemoglobin: 13.3 g/dL (ref 12.0–15.0)
Potassium: 3.2 mmol/L — ABNORMAL LOW (ref 3.5–5.1)
Sodium: 144 mmol/L (ref 135–145)
TCO2: 26 mmol/L (ref 22–32)

## 2020-01-17 MED ORDER — MORPHINE SULFATE (PF) 4 MG/ML IV SOLN
4.0000 mg | Freq: Once | INTRAVENOUS | Status: AC
  Administered 2020-01-17: 4 mg via INTRAVENOUS
  Filled 2020-01-17: qty 1

## 2020-01-17 MED ORDER — DIPHENHYDRAMINE HCL 50 MG/ML IJ SOLN
25.0000 mg | Freq: Once | INTRAMUSCULAR | Status: AC
  Administered 2020-01-17: 12:00:00 25 mg via INTRAVENOUS
  Filled 2020-01-17: qty 1

## 2020-01-17 MED ORDER — ONDANSETRON HCL 4 MG/2ML IJ SOLN
4.0000 mg | Freq: Once | INTRAMUSCULAR | Status: AC
  Administered 2020-01-17: 4 mg via INTRAVENOUS
  Filled 2020-01-17: qty 2

## 2020-01-17 MED ORDER — HYDROCODONE-ACETAMINOPHEN 5-325 MG PO TABS: 1.0000 | ORAL_TABLET | ORAL | 0 refills | Status: DC | PRN

## 2020-01-17 MED ORDER — DEXAMETHASONE SODIUM PHOSPHATE 10 MG/ML IJ SOLN
10.0000 mg | Freq: Once | INTRAMUSCULAR | Status: AC
  Administered 2020-01-17: 12:00:00 10 mg via INTRAVENOUS
  Filled 2020-01-17: qty 1

## 2020-01-17 MED ORDER — PROCHLORPERAZINE EDISYLATE 10 MG/2ML IJ SOLN
10.0000 mg | Freq: Once | INTRAMUSCULAR | Status: AC
  Administered 2020-01-17: 12:00:00 10 mg via INTRAVENOUS
  Filled 2020-01-17: qty 2

## 2020-01-17 MED ORDER — LORAZEPAM 2 MG/ML IJ SOLN: 0.5000 mg | Freq: Once | INTRAMUSCULAR | Status: DC

## 2020-01-17 MED ORDER — SODIUM CHLORIDE 0.9 % IV BOLUS
500.0000 mL | Freq: Once | INTRAVENOUS | Status: AC
  Administered 2020-01-17: 500 mL via INTRAVENOUS

## 2020-01-17 NOTE — Discharge Instructions (Signed)
Go home and sleep this evening.  Hopefully you will wake with your headache resolved.  I encourage you to contact your neurologist for a refill of your Ajovy and also for further management if your headache persists.  Your labs, CT scan and MRI are reassuring today as discussed.  You have been prescribed a small quantity of hydrocodone for you to take if needed for persistent headache.  However this medication will make you drowsy, do not drive within 4 hours of taking this medication.  You have also been given sedating medications while here, you will need a friend or family member to drive you home this evening.

## 2020-01-17 NOTE — ED Notes (Signed)
Pt in bed with eyes closed, pt arouses easily to verbal stim, pt states that she was taking a little nap, pt states that her pain has decreased, states that her pain is now a 4/10

## 2020-01-17 NOTE — ED Provider Notes (Signed)
[facility] EMERGENCY DEPARTMENT Provider Note   CSN: [id number] Arrival date & time: 01/17/20  1115     History Chief Complaint  Patient presents with  . Headache    Abigail Townsend is a 64 y.o. femalewith a history most significant for asthma, CAD, GERD, migraine headaches and hypertension also hyperlipidemia presenting with a 2-day history of migraine headache.  She reports she developed a migraine yesterday morning which is her typical migraine headache involving photophobia and nausea without emesis but no scotoma.  She took Tylenol and slept in a dark room but did not get complete relief of her headache which persisted for the rest of the day.  She takes gabapentin and Lyrica daily for migraine prevention and has not missed any doses.  She also has been prescribed Ajovy for monthly subcu injection by her neurologist but ran out of this medication last month.  She was at work this morning when her headache became worse approximately 45 minutes prior to arrival.  A coworker noticed she was having difficulty speaking and EMS confirms the same symptom which has resolved by the time of arrival.  She reports having some light headedness yesterday which persist today.  She does endorse reduced p.o. intake since his headache began.  She denies focal weakness at this time.  She has had no treatments prior to arrival this morning.  HPI     Past Medical History:  Diagnosis Date  . Anemia    hx of  . Apnea, sleep    dr Perley Jain [phone]  . Arthritis    osteo rt knee   . Asthma    Albuterol Prn;takes Sindulair daily and uses Symbicort daily  . Bruises easily    d/t being on Effient and baby ASA--cardiologist told pt not to stop these  . Constipation   . Coronary artery disease   . Diabetes mellitus without complication (HCC)    takes Metformin and Januvia daily  . GERD (gastroesophageal reflux disease)    takes Protonix daily  . Headache(784.0)    takes Maxalt daily prn and Lyrica  daily  . Heart murmur    since birth   . History of kidney stones    passed on her on many yrs ago  . Hyperlipidemia    takes Crestor nightly  . Hypertension    takes Enalapril and Metoprolol daily as well as Maxzide   . IBS (irritable bowel syndrome)    amitiza bid  . Insomnia    takes Elavil nightly and Ambien nightly  . Joint pain   . Joint swelling   . Left knee DJD   . PONV (postoperative nausea and vomiting)   . S/P total knee arthroplasty    right  . Sleep apnea    sleep study done a yr ago-to request    Patient Active Problem List   Diagnosis Date Noted  . Arthritis   . Insomnia   . GERD (gastroesophageal reflux disease)   . Hypertension   . Diabetes mellitus without complication (HCC)   . PONV (postoperative nausea and vomiting)   . Hyperlipidemia   . Coronary artery disease   . IBS (irritable bowel syndrome)   . Sleep apnea   . Anemia   . Left knee DJD   . S/P total knee arthroplasty   . Impingement syndrome of left shoulder 06/05/2011    Past Surgical History:  Procedure Laterality Date  . ABDOMINAL HYSTERECTOMY     1994    only uterus  .  BLADDER REPAIR  2007   bladder sling   . BREAST LUMPECTOMY  6/89  11/2003    right breast   benign   . BREAST REDUCTION SURGERY  1996    bilat   . BREAST SURGERY  1997   suture repair rt breaast   . CARDIAC CATHETERIZATION     10/2010 12/2010  . CHOLECYSTECTOMY     1981  . CORONARY ANGIOPLASTY WITH STENT PLACEMENT  2 stents   10/2010  . ESOPHAGOGASTRODUODENOSCOPY    . JOINT REPLACEMENT     2009 rt knee   . LAPAROSCOPY  1987  . left shoulder surgery    . PATELLA REALIGNMENT  2010   rt knee   . TOTAL KNEE ARTHROPLASTY Left 05/05/2013   Dr Madelon Lips  . TOTAL KNEE ARTHROPLASTY Left 05/05/2013   Procedure: TOTAL KNEE ARTHROPLASTY;  Surgeon: Thera Flake., MD;  Location: [facility] OR;  Service: Orthopedics;  Laterality: Left;     OB History   No obstetric history on file.     No family history on file.  Social  History   Tobacco Use  . Smoking status: Former Games developer  . Smokeless tobacco: Never Used  . Tobacco comment: quit in 1998  Substance Use Topics  . Alcohol use: Yes    Comment: rarely  . Drug use: No    Home Medications Prior to Admission medications   Medication Sig Start Date End Date Taking? Authorizing Provider  acetaminophen (TYLENOL) 500 MG tablet Take 1,000 mg by mouth 2 (two) times daily as needed for pain.    [provider]  albuterol (PROVENTIL HFA;VENTOLIN HFA) 108 (90 BASE) MCG/ACT inhaler Inhale 2 puffs into the lungs every 6 (six) hours as needed for wheezing or shortness of breath.    [provider]  amitriptyline (ELAVIL) 25 MG tablet Take 25 mg by mouth at bedtime.    [provider]  aspirin EC 81 MG tablet Take 81 mg by mouth daily.    [provider]  budesonide-formoterol (SYMBICORT) 160-4.5 MCG/ACT inhaler Inhale 2 puffs into the lungs daily.    [provider]  Calcium Citrate-Vitamin D (CITRACAL + D PO) Take 1,000 mg by mouth daily.    [provider]  Cholecalciferol (VITAMIN D) 2000 UNITS tablet Take 2,000 Units by mouth daily.    [provider]  enalapril (VASOTEC) 5 MG tablet Take 5 mg by mouth daily.     [provider]  enoxaparin (LOVENOX) 30 MG/0.3ML injection Inject 0.3 mLs (30 mg total) into the skin every 12 (twelve) hours. 05/08/13   Chadwell, Ivin Booty, PA-C  HYDROcodone-acetaminophen (NORCO/VICODIN) 5-325 MG tablet Take 1 tablet by mouth every 4 (four) hours as needed for moderate pain. 01/17/20   Burgess Amor, PA-C  metFORMIN (GLUCOPHAGE) 1000 MG tablet Take 1,000 mg by mouth 2 (two) times daily with a meal.    [provider]  metoprolol succinate (TOPROL-XL) 25 MG 24 hr tablet Take 25 mg by mouth daily.    [provider]  montelukast (SINGULAIR) 10 MG tablet Take 10 mg by mouth daily.    [provider]  Multiple Vitamin (MULTIVITAMIN WITH MINERALS) TABS  tablet Take 1 tablet by mouth daily.    [provider]  nitroGLYCERIN (NITROSTAT) 0.4 MG SL tablet Place 0.4 mg under the tongue every 5 (five) minutes as needed for chest pain.    [provider]  Omega-3 Fatty Acids (FISH OIL) 1000 MG CAPS Take 2,000 mg by  mouth 2 (two) times daily.    [provider]  oxyCODONE (ROXICODONE) 5 MG immediate release tablet 1-2 tabs po q4-6hrs prn pain 05/05/13   Chadwell, Ivin Booty, PA-C  pantoprazole (PROTONIX) 40 MG tablet Take 40 mg by mouth daily.    [provider]  pregabalin (LYRICA) 150 MG capsule Take 150-300 mg by mouth 2 (two) times daily. Take 1 capsule (150 m) every morning and 2 capsules (300 mg) at bedtime    [provider]  rizatriptan (MAXALT-MLT) 10 MG disintegrating tablet Take 10 mg by mouth 2 (two) times daily as needed for migraine. May repeat in 2 hours if needed    [provider]  rosuvastatin (CRESTOR) 40 MG tablet Take 40 mg by mouth at bedtime.    [provider]  sitaGLIPtin (JANUVIA) 100 MG tablet Take 100 mg by mouth daily.    [provider]  triamcinolone (NASACORT AQ) 55 MCG/ACT nasal inhaler Place 2 sprays into the nose at bedtime.    [provider]  triamterene-hydrochlorothiazide (MAXZIDE-25) 37.5-25 MG per tablet Take 1 tablet by mouth daily.    [provider]  zolpidem (AMBIEN) 10 MG tablet Take 10 mg by mouth at bedtime.    [provider]    Allergies    Erythromycin and Other  Review of Systems   Review of Systems  Constitutional: Negative for fever.  HENT: Negative for congestion and sore throat.   Eyes: Positive for photophobia.  Respiratory: Negative for chest tightness and shortness of breath.   Cardiovascular: Negative for chest pain.  Gastrointestinal: Negative for abdominal pain and nausea.  Genitourinary: Negative.   Musculoskeletal: Negative for arthralgias, joint swelling and neck pain.  Skin: Negative.   Negative for rash and wound.  Neurological: Positive for headaches. Negative for dizziness, weakness, light-headedness and numbness.  Psychiatric/Behavioral: Negative.     Physical Exam Updated Vital Signs BP (!) 164/68 (BP Location: Right Arm)   Pulse 90   Temp 98.8 F (37.1 C) (Oral)   Resp 16   Ht  (1.626 m)   Wt 108.9 kg   SpO2 94%   BMI 41.20 kg/m   Physical Exam Vitals and nursing note reviewed.  Constitutional:      Appearance: She is well-developed.     Comments: Uncomfortable appearing  HENT:     Head: Normocephalic and atraumatic.  Eyes:     General: Lids are normal. Gaze aligned appropriately.     Pupils: Pupils are equal, round, and reactive to light.     Comments: Photophobia present  Cardiovascular:     Rate and Rhythm: Normal rate.     Heart sounds: Normal heart sounds.  Pulmonary:     Effort: Pulmonary effort is normal.  Abdominal:     Palpations: Abdomen is soft.     Tenderness: There is no abdominal tenderness.  Musculoskeletal:        General: Normal range of motion.     Cervical back: Normal range of motion and neck supple.  Lymphadenopathy:     Cervical: No cervical adenopathy.  Skin:    General: Skin is warm and dry.     Findings: No rash.  Neurological:     General: No focal deficit present.     Mental Status: She is alert and oriented to person, place, and time.     GCS: GCS eye subscore is 4. GCS verbal subscore is 5. GCS motor subscore is 6.     Cranial Nerves: No cranial  nerve deficit.     Sensory: Sensation is intact. No sensory deficit.     Comments:  Cranial nerves III-XII intact.  No pronator drift. Equal grip stength. No loss of strength in ankles with full range of motion and no foot drop.  Pt unable to perform heel/shin secondary to chronic left knee pain and stiffness. Sensation intact all extremities.  No dysarthria or aphasia at present.   Psychiatric:        Speech: Speech normal.        Behavior: Behavior normal.         Thought Content: Thought content normal.     ED Results / Procedures / Treatments   Labs (all labs ordered are listed, but only abnormal results are displayed) Labs Reviewed  BASIC METABOLIC PANEL - Abnormal; Notable for the following components:      Result Value   Potassium 3.2 (*)    Glucose, Bld 147 (*)    All other components within normal limits  I-STAT CHEM 8, ED - Abnormal; Notable for the following components:   Potassium 3.2 (*)    Glucose, Bld 143 (*)    All other components within normal limits  CBC WITH DIFFERENTIAL/PLATELET    EKG None  Radiology CT Head Wo Contrast  Result Date: 01/17/2020 CLINICAL DATA:  Migraine headaches EXAM: CT HEAD WITHOUT CONTRAST TECHNIQUE: Contiguous axial images were obtained from the base of the skull through the vertex without intravenous contrast. COMPARISON:  None. FINDINGS: Brain: No evidence of acute infarction, hemorrhage, hydrocephalus, extra-axial collection or mass lesion/mass effect. Vascular: No hyperdense vessel or unexpected calcification. Skull: Normal. Negative for fracture or focal lesion. Sinuses/Orbits: No acute finding. Other: None. IMPRESSION: No acute abnormality noted. Electronically Signed   By: Alcide Clever M.D.   On: 01/17/2020 13:01   MR BRAIN WO CONTRAST  Result Date: 01/17/2020 CLINICAL DATA:  Acute headache EXAM: MRI HEAD WITHOUT CONTRAST TECHNIQUE: Multiplanar, multiecho pulse sequences of the brain and surrounding structures were obtained without intravenous contrast. COMPARISON:  None. FINDINGS: Brain: There is no acute infarction or intracranial hemorrhage. There is no intracranial mass, mass effect, or edema. There is no hydrocephalus or extra-axial fluid collection. Ventricles and sulci are normal in size and configuration. Minimal patchy T2 hyperintensity in the supratentorial white matter is nonspecific but may reflect minor chronic microvascular ischemic changes. Vascular: Major vessel flow voids at the  skull base are preserved. Skull and upper cervical spine: Normal marrow signal is preserved. Sinuses/Orbits: Circumferential mucosal thickening and complete opacification of maxillary sinuses. There is new since CT earlier same day. Orbits are unremarkable. Other: Sella is unremarkable.  Mastoid air cells are clear. IMPRESSION: No intracranial mass or hemorrhage. Electronically Signed   By: Guadlupe Spanish M.D.   On: 01/17/2020 15:13    Procedures Procedures (including critical care time)  Medications Ordered in ED Medications  prochlorperazine (COMPAZINE) injection 10 mg (10 mg Intravenous Given 01/17/20 1218)  dexamethasone (DECADRON) injection 10 mg (10 mg Intravenous Given 01/17/20 1217)  diphenhydrAMINE (BENADRYL) injection 25 mg (25 mg Intravenous Given 01/17/20 1215)  morphine 4 MG/ML injection 4 mg (4 mg Intravenous Given 01/17/20 1302)  ondansetron (ZOFRAN) injection 4 mg (4 mg Intravenous Given 01/17/20 1301)  sodium chloride 0.9 % bolus 500 mL (0 mLs Intravenous Stopped 01/17/20 1626)    ED Course  I have reviewed the triage vital signs and the nursing notes.  Pertinent labs & imaging results that were available during my care of the patient were reviewed  by me and considered in my medical decision making (see chart for details).    MDM Rules/Calculators/A&P                          Pt with acute intractable migraine headache.  She was given decadron, compazine and benadryl with no significant improvement in headache, but nausea/vomiting improved but not resolved.  She was given morphine with zofran.  Nausea gone,  Headache starting to improve.  Additional information obtained - pt with left sided neck soreness since yesterday.  ttp left neck musculature.  No nuchal rigidity.  CT negative for acute bleed or other intracranial abnormality.  With persistent headache,  MRI also completed with normal findings.  Given gentle IV fluids, after which headache improved, 4/10 from 10/10.  No  nausea,  Still with photophobia but better.  She felt comfortable going home with current sx.  She takes daily keppra and gabapentin for migraine prophylaxis, has also been on ajovy SQ monthly ran out last month.  Advised f[place]in [place] for further prescription of this medicine.  Advised home rest, prescribed small quantity of hydrocodone if needed for persistent headache.      Final Clinical Impression(s) / ED Diagnoses Final diagnoses:  Complicated migraine    Rx / DC Orders ED Discharge Orders         Ordered    HYDROcodone-acetaminophen (NORCO/VICODIN) 5-325 MG tablet  Every 4 hours PRN     Discontinue  Reprint     01/17/20 1638           Burgess Amordol, Brynnly Bonet, PA-C 01/17/20 1854    Blane OharaZavitz, Joshua, MD 01/18/20 1547

## 2020-01-17 NOTE — ED Triage Notes (Signed)
EMS reports pt c/o of migraine since yesterday but got worse ago.  CBG 130.  Staff says they witnessed some slurred speech, ems says they noticed some aphasia.  Reports grip strength equal.  bp 183/76.  Pt alert and oriented.  EDP notified

## 2021-11-20 ENCOUNTER — Other Ambulatory Visit: Payer: Self-pay | Admitting: Orthopedic Surgery

## 2021-11-20 DIAGNOSIS — M75102 Unspecified rotator cuff tear or rupture of left shoulder, not specified as traumatic: Secondary | ICD-10-CM

## 2021-11-29 ENCOUNTER — Ambulatory Visit
Admission: RE | Admit: 2021-11-29 | Discharge: 2021-11-29 | Disposition: A | Discharge status: Home / Self Care | Payer: BLUE CROSS/BLUE SHIELD | Source: Ambulatory Visit | Attending: Orthopedic Surgery | Admitting: Orthopedic Surgery

## 2021-11-29 DIAGNOSIS — M75102 Unspecified rotator cuff tear or rupture of left shoulder, not specified as traumatic: Secondary | ICD-10-CM

## 2023-09-20 ENCOUNTER — Encounter (HOSPITAL_COMMUNITY): Payer: Self-pay | Admitting: Emergency Medicine

## 2023-09-20 ENCOUNTER — Emergency Department (HOSPITAL_COMMUNITY)

## 2023-09-20 ENCOUNTER — Other Ambulatory Visit: Payer: Self-pay

## 2023-09-20 ENCOUNTER — Emergency Department (HOSPITAL_COMMUNITY)
Admission: EM | Admit: 2023-09-20 | Discharge: 2023-09-20 | Disposition: A | Discharge status: Home / Self Care | Attending: Emergency Medicine | Admitting: Emergency Medicine

## 2023-09-20 DIAGNOSIS — N23 Unspecified renal colic: Secondary | ICD-10-CM | POA: Insufficient documentation

## 2023-09-20 DIAGNOSIS — Z7982 Long term (current) use of aspirin: Secondary | ICD-10-CM | POA: Diagnosis not present

## 2023-09-20 DIAGNOSIS — R1032 Left lower quadrant pain: Secondary | ICD-10-CM | POA: Diagnosis present

## 2023-09-20 LAB — CBC WITH DIFFERENTIAL/PLATELET
Abs Immature Granulocytes: 0.03 10*3/uL (ref 0.00–0.07)
Basophils Absolute: 0.1 10*3/uL (ref 0.0–0.1)
Basophils Relative: 1 %
Eosinophils Absolute: 0.1 10*3/uL (ref 0.0–0.5)
Eosinophils Relative: 1 %
HCT: 42.3 % (ref 36.0–46.0)
Hemoglobin: 14.1 g/dL (ref 12.0–15.0)
Immature Granulocytes: 0 %
Lymphocytes Relative: 11 %
Lymphs Abs: 1.4 10*3/uL (ref 0.7–4.0)
MCH: 27 pg (ref 26.0–34.0)
MCHC: 33.3 g/dL (ref 30.0–36.0)
MCV: 81 fL (ref 80.0–100.0)
Monocytes Absolute: 0.4 10*3/uL (ref 0.1–1.0)
Monocytes Relative: 3 %
Neutro Abs: 10.2 10*3/uL — ABNORMAL HIGH (ref 1.7–7.7)
Neutrophils Relative %: 84 %
Platelets: 158 10*3/uL (ref 150–400)
RBC: 5.22 MIL/uL — ABNORMAL HIGH (ref 3.87–5.11)
RDW: 14.1 % (ref 11.5–15.5)
WBC: 12.2 10*3/uL — ABNORMAL HIGH (ref 4.0–10.5)
nRBC: 0 % (ref 0.0–0.2)

## 2023-09-20 LAB — COMPREHENSIVE METABOLIC PANEL
ALT: 9 U/L (ref 0–44)
AST: 15 U/L (ref 15–41)
Albumin: 3.6 g/dL (ref 3.5–5.0)
Alkaline Phosphatase: 66 U/L (ref 38–126)
Anion gap: 15 (ref 5–15)
BUN: 13 mg/dL (ref 8–23)
CO2: 21 mmol/L — ABNORMAL LOW (ref 22–32)
Calcium: 9.9 mg/dL (ref 8.9–10.3)
Chloride: 104 mmol/L (ref 98–111)
Creatinine, Ser: 1.03 mg/dL — ABNORMAL HIGH (ref 0.44–1.00)
GFR, Estimated: 60 mL/min — ABNORMAL LOW (ref >60)
Glucose, Bld: 195 mg/dL — ABNORMAL HIGH (ref 70–99)
Potassium: 3.7 mmol/L (ref 3.5–5.1)
Sodium: 140 mmol/L (ref 135–145)
Total Bilirubin: 0.6 mg/dL (ref 0.0–1.2)
Total Protein: 6.8 g/dL (ref 6.5–8.1)

## 2023-09-20 LAB — URINALYSIS, ROUTINE W REFLEX MICROSCOPIC
Bilirubin Urine: NEGATIVE
Glucose, UA: 50 mg/dL — AB
Ketones, ur: 5 mg/dL — AB
Leukocytes,Ua: NEGATIVE
Nitrite: NEGATIVE
Protein, ur: 100 mg/dL — AB
RBC / HPF: >50 RBC/hpf (ref 0–5)
Specific Gravity, Urine: 1.017 (ref 1.005–1.030)
pH: 6 (ref 5.0–8.0)

## 2023-09-20 LAB — LIPASE, BLOOD: Lipase: 26 U/L (ref 11–51)

## 2023-09-20 MED ORDER — KETOROLAC TROMETHAMINE 30 MG/ML IJ SOLN
30.0000 mg | Freq: Once | INTRAMUSCULAR | Status: AC
  Administered 2023-09-20: 30 mg via INTRAVENOUS
  Filled 2023-09-20: qty 1

## 2023-09-20 MED ORDER — ONDANSETRON 4 MG PO TBDP: 4.0000 mg | ORAL_TABLET | Freq: Once | ORAL | Status: DC

## 2023-09-20 MED ORDER — SODIUM CHLORIDE 0.9 % IV BOLUS
1000.0000 mL | Freq: Once | INTRAVENOUS | Status: AC
  Administered 2023-09-20: 1000 mL via INTRAVENOUS

## 2023-09-20 MED ORDER — HYDROMORPHONE HCL 1 MG/ML IJ SOLN
1.0000 mg | Freq: Once | INTRAMUSCULAR | Status: AC
  Administered 2023-09-20: 1 mg via INTRAVENOUS
  Filled 2023-09-20: qty 1

## 2023-09-20 MED ORDER — MORPHINE SULFATE (PF) 4 MG/ML IV SOLN
4.0000 mg | Freq: Once | INTRAVENOUS | Status: AC
  Administered 2023-09-20: 4 mg via INTRAVENOUS
  Filled 2023-09-20: qty 1

## 2023-09-20 MED ORDER — TAMSULOSIN HCL 0.4 MG PO CAPS: 0.4000 mg | ORAL_CAPSULE | Freq: Every day | ORAL | 0 refills | Status: AC

## 2023-09-20 MED ORDER — ONDANSETRON HCL 4 MG/2ML IJ SOLN
4.0000 mg | Freq: Once | INTRAMUSCULAR | Status: AC
  Administered 2023-09-20: 4 mg via INTRAVENOUS
  Filled 2023-09-20: qty 2

## 2023-09-20 MED ORDER — ONDANSETRON 4 MG PO TBDP: ORAL_TABLET | ORAL | 0 refills | Status: AC

## 2023-09-20 MED ORDER — OXYCODONE HCL 5 MG PO TABS
5.0000 mg | ORAL_TABLET | Freq: Once | ORAL | Status: DC
  Filled 2023-09-20: qty 1

## 2023-09-20 MED ORDER — IOHEXOL 350 MG/ML SOLN
75.0000 mL | Freq: Once | INTRAVENOUS | Status: AC | PRN
  Administered 2023-09-20: 75 mL via INTRAVENOUS

## 2023-09-20 MED ORDER — OXYCODONE-ACETAMINOPHEN 5-325 MG PO TABS: 1.0000 | ORAL_TABLET | ORAL | 0 refills | Status: DC | PRN

## 2023-09-20 NOTE — Discharge Instructions (Addendum)
 You have a left-sided kidney stone  Take Tylenol for pain and Percocet for severe pain.  I have also prescribed Flomax.  You can take Zofran for nausea  As we discussed, you need to call urology tomorrow for appointment in the week  Return to ER if you have severe abdominal pain or vomiting or fever

## 2023-09-20 NOTE — ED Provider Triage Note (Signed)
 Emergency Medicine Provider Triage Evaluation Note  Abigail Townsend , a 68 y.o. female  was evaluated in triage.  Pt complains of LLQ and L flank pain that started an hour ago. Pos N/V, Dysuria.  Review of Systems  Positive:  Negative:   Physical Exam  BP (!) 184/70   Pulse 72   Temp 98.5 F (36.9 C) (Oral)   Resp 18   SpO2 95%  Gen:   Awake, in clear discomfort Resp:  Normal effort  MSK:   Moves extremities without difficulty  Other:  Abd with generalized tenderness worse LLQ and +CVAT L  Medical Decision Making  Medically screening exam initiated at 12:47 PM.  Appropriate orders placed.  Abigail Townsend was informed that the remainder of the evaluation will be completed by another provider, this initial triage assessment does not replace that evaluation, and the importance of remaining in the ED until their evaluation is complete.     Abigail Decamp, PA-C 09/20/23 1248

## 2023-09-20 NOTE — ED Provider Notes (Signed)
 [place] EMERGENCY DEPARTMENT AT [facility] Provider Note   CSN: [id number] Arrival date & time: 09/20/23  1204     History  Chief Complaint  Patient presents with   Abdominal Pain    Abigail Townsend is a 68 y.o. female history of kidney stone here presenting with left flank pain.  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  She states that it is sharp and radiate to the left flank.  She is concerned that she may have a kidney stone.  She states that she had kidney stone many years ago and has not been following with urology.  Denies any fevers or chills or dysuria  The history is provided by the patient.       Home Medications Prior to Admission medications   Medication Sig Start Date End Date Taking? Authorizing Provider  acetaminophen (TYLENOL) 500 MG tablet Take 1,000 mg by mouth 2 (two) times daily as needed for pain.    [provider]  albuterol (PROVENTIL HFA;VENTOLIN HFA) 108 (90 BASE) MCG/ACT inhaler Inhale 2 puffs into the lungs every 6 (six) hours as needed for wheezing or shortness of breath.    [provider]  amitriptyline (ELAVIL) 25 MG tablet Take 25 mg by mouth at bedtime.    [provider]  aspirin EC 81 MG tablet Take 81 mg by mouth daily.    [provider]  budesonide-formoterol (SYMBICORT) 160-4.5 MCG/ACT inhaler Inhale 2 puffs into the lungs daily.    [provider]  Calcium Citrate-Vitamin D (CITRACAL + D PO) Take 1,000 mg by mouth daily.    [provider]  Cholecalciferol (VITAMIN D) 2000 UNITS tablet Take 2,000 Units by mouth daily.    [provider]  enalapril (VASOTEC) 5 MG tablet Take 5 mg by mouth daily.     [provider]  enoxaparin (LOVENOX) 30 MG/0.3ML injection Inject 0.3 mLs (30 mg total) into the skin every 12 (twelve) hours. 05/08/13   Chadwell, Ivin Booty, PA-C  HYDROcodone-acetaminophen (NORCO/VICODIN)  5-325 MG tablet Take 1 tablet by mouth every 4 (four) hours as needed for moderate pain. 01/17/20   Burgess Amor, PA-C  metFORMIN (GLUCOPHAGE) 1000 MG tablet Take 1,000 mg by mouth 2 (two) times daily with a meal.    [provider]  metoprolol succinate (TOPROL-XL) 25 MG 24 hr tablet Take 25 mg by mouth daily.    [provider]  montelukast (SINGULAIR) 10 MG tablet Take 10 mg by mouth daily.    [provider]  Multiple Vitamin (MULTIVITAMIN WITH MINERALS) TABS tablet Take 1 tablet by mouth daily.    [provider]  nitroGLYCERIN (NITROSTAT) 0.4 MG SL tablet Place 0.4 mg under the tongue every 5 (five) minutes as needed for chest pain.    [provider]  Omega-3 Fatty Acids (FISH OIL) 1000 MG CAPS Take 2,000 mg by mouth 2 (two) times daily.    [provider]  oxyCODONE (ROXICODONE) 5 MG immediate release tablet 1-2 tabs po q4-6hrs prn pain 05/05/13   Chadwell, Ivin Booty, PA-C  pantoprazole (PROTONIX) 40 MG tablet Take 40 mg by mouth daily.    [provider]  pregabalin (LYRICA) 150 MG capsule Take 150-300 mg by mouth 2 (two) times daily. Take 1 capsule (150 m) every morning and 2 capsules (300 mg) at bedtime    [provider]  rizatriptan (MAXALT-MLT) 10 MG disintegrating tablet Take 10  mg by mouth 2 (two) times daily as needed for migraine. May repeat in 2 hours if needed    [provider]  rosuvastatin (CRESTOR) 40 MG tablet Take 40 mg by mouth at bedtime.    [provider]  sitaGLIPtin (JANUVIA) 100 MG tablet Take 100 mg by mouth daily.    [provider]  triamcinolone (NASACORT AQ) 55 MCG/ACT nasal inhaler Place 2 sprays into the nose at bedtime.    [provider]  triamterene-hydrochlorothiazide (MAXZIDE-25) 37.5-25 MG per tablet Take 1 tablet by mouth daily.    [provider]  zolpidem (AMBIEN) 10 MG tablet Take 10 mg by mouth at bedtime.    [provider]       Allergies    Erythromycin and Other    Review of Systems   Review of Systems  Gastrointestinal:  Positive for abdominal pain.  All other systems reviewed and are negative.   Physical Exam Updated Vital Signs BP (!) 167/71 (BP Location: Right Arm)   Pulse 88   Temp 97.7 F (36.5 C) (Oral)   Resp 16   SpO2 99%  Physical Exam Vitals and nursing note reviewed.  Constitutional:      Comments: Uncomfortable   HENT:     Head: Normocephalic.  Eyes:     Extraocular Movements: Extraocular movements intact.     Pupils: Pupils are equal, round, and reactive to light.  Cardiovascular:     Rate and Rhythm: Normal rate and regular rhythm.     Heart sounds: Normal heart sounds.  Pulmonary:     Effort: Pulmonary effort is normal.     Breath sounds: Normal breath sounds.  Abdominal:     Comments: Mild left lower quadrant tenderness.  No CVA tenderness  Skin:    General: Skin is warm.     Capillary Refill: Capillary refill takes less than 2 seconds.  Neurological:     General: No focal deficit present.     Mental Status: She is oriented to person, place, and time.     ED Results / Procedures / Treatments   Labs (all labs ordered are listed, but only abnormal results are displayed) Labs Reviewed  COMPREHENSIVE METABOLIC PANEL - Abnormal; Notable for the following components:      Result Value   CO2 21 (*)    Glucose, Bld 195 (*)    Creatinine, Ser 1.03 (*)    GFR, Estimated 60 (*)    All other components within normal limits  CBC WITH DIFFERENTIAL/PLATELET - Abnormal; Notable for the following components:   WBC 12.2 (*)    RBC 5.22 (*)    Neutro Abs 10.2 (*)    All other components within normal limits  URINALYSIS, ROUTINE W REFLEX MICROSCOPIC - Abnormal; Notable for the following components:   APPearance CLOUDY (*)    Glucose, UA 50 (*)    Hgb urine dipstick LARGE (*)    Ketones, ur 5 (*)    Protein, ur 100 (*)    Bacteria, UA RARE (*)    All other components within  normal limits  LIPASE, BLOOD    EKG None  Radiology No results found.  Procedures Procedures    Medications Ordered in ED Medications  ondansetron (ZOFRAN) injection 4 mg (4 mg Intravenous Given 09/20/23 1513)  iohexol (OMNIPAQUE) 350 MG/ML injection 75 mL (75 mLs Intravenous Contrast Given 09/20/23 1603)  ketorolac (TORADOL) 30 MG/ML injection 30 mg (30 mg Intravenous Given 09/20/23 1723)  morphine (PF) 4  MG/ML injection 4 mg (4 mg Intravenous Given 09/20/23 1719)  sodium chloride 0.9 % bolus 1,000 mL (1,000 mLs Intravenous New Bag/Given 09/20/23 1726)    ED Course/ Medical Decision Making/ A&P                                 Medical Decision Making RIYAH BARDON is a 68 y.o. female here presenting with left lower quadrant tenderness.  Concern for possible renal colic versus diverticulitis.  Plan to get CBC CMP and CT abdomen pelvis and urinalysis.  Will give pain medicine and anti-inflammatories and reassess.  7:40 PM I reviewed patient's labs and white blood cell count is 12.  UA showed greater than 50 red blood cells but no obvious infection.  CT showed 4 mm left proximal ureteral stone.  Patient was given Toradol and pain medicine and felt better.  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  Will prescribe pain medicine and Flomax and have her follow-up with urology  Problems Addressed: Renal colic on left side: acute illness or injury  Amount and/or Complexity of Data Reviewed Labs: ordered. Decision-making details documented in ED Course. Radiology: ordered and independent interpretation performed. Decision-making details documented in ED Course.  Risk Prescription drug management.    Final Clinical Impression(s) / ED Diagnoses Final diagnoses:  None    Rx / DC Orders ED Discharge Orders     None         Charlynne Pander, MD 09/20/23 [phone]

## 2023-09-20 NOTE — ED Notes (Signed)
 Pt has urine cup and knows the need for urine

## 2023-09-20 NOTE — ED Triage Notes (Signed)
 Pt BIB by EMS from work for sudden onset of LLQ pain. Endorses nausea but not vomiting. Denies any CP or SHOB. CBG 179.

## 2023-10-05 ENCOUNTER — Other Ambulatory Visit: Payer: Self-pay | Admitting: Urology

## 2023-10-26 NOTE — Patient Instructions (Signed)
 SURGICAL WAITING ROOM VISITATION Patients having surgery or a procedure may have no more than 2 support people in the waiting area - these visitors may rotate in the visitor waiting room.   If the patient needs to stay at the hospital during part of their recovery, the visitor guidelines for inpatient rooms apply.  PRE-OP VISITATION  Pre-op nurse will coordinate an appropriate time for 1 support person to accompany the patient in pre-op.  This support person may not rotate.  This visitor will be contacted when the time is appropriate for the visitor to come back in the pre-op area.  Please refer to the [facility] website for the visitor guidelines for Inpatients (after your surgery is over and you are in a regular room).  You are not required to quarantine at this time prior to your surgery. However, you must do this: Hand Hygiene often Do NOT share personal items Notify your provider if you are in close contact with someone who has COVID or you develop fever 100.4 or greater, new onset of sneezing, cough, sore throat, shortness of breath or body aches.  If you test positive for Covid or have been in contact with anyone that has tested positive in the last 10 days please notify you surgeon.    Your procedure is scheduled on:  Thursday  November 04, 2023  Report to [facility] Main Entrance: Leota Jacobsen entrance where the [employer] is available.   Report to admitting at:  10:45   AM  Call this number if you have any questions or problems the morning of surgery [phone]  DO NOT EAT OR DRINK ANYTHING AFTER MIDNIGHT THE NIGHT PRIOR TO YOUR SURGERY / PROCEDURE.   FOLLOW  ANY ADDITIONAL PRE OP INSTRUCTIONS YOU RECEIVED FROM YOUR SURGEON'S OFFICE!!!   Oral Hygiene is also important to reduce your risk of infection.        Remember - BRUSH YOUR TEETH THE MORNING OF SURGERY WITH YOUR REGULAR TOOTHPASTE  Do NOT smoke after Midnight the night before surgery.  STOP TAKING all  Vitamins, Herbs and supplements 1 week before your surgery.   Take ONLY these medicines the morning of surgery with A SIP OF WATER: Metoprolol, and you may take Tylenol in needed for pain. You may use your Nasal Spray if needed.   If You have been diagnosed with Sleep Apnea - Bring CPAP mask and tubing day of surgery. We will provide you with a CPAP machine on the day of your surgery.                   You may not have any metal on your body including hair pins, jewelry, and body piercing  Do not wear make-up, lotions, powders, perfumes  or deodorant  Do not wear nail polish including gel and S&S, artificial / acrylic nails, or any other type of covering on natural nails including finger and toenails. If you have artificial nails, gel coating, etc., that needs to be removed by a nail salon, Please have this removed prior to surgery. Not doing so may mean that your surgery could be cancelled or delayed if the Surgeon or anesthesia staff feels like they are unable to monitor you safely.   Do not shave 48 hours prior to surgery to avoid nicks in your skin which may contribute to postoperative infections.    Contacts, Hearing Aids, dentures or bridgework may not be worn into surgery. DENTURES WILL BE REMOVED PRIOR TO SURGERY PLEASE DO  NOT APPLY "Poly grip" OR ADHESIVES!!!  You may bring a small overnight bag with you on the day of surgery, only pack items that are not valuable. [facility] IS NOT RESPONSIBLE   FOR VALUABLES THAT ARE LOST OR STOLEN.   Patients discharged on the day of surgery will not be allowed to drive home.  Someone NEEDS to stay with you for the first 24 hours after anesthesia.  Do not bring your home medications to the hospital. The Pharmacy will dispense medications listed on your medication list to you during your admission in the Hospital.  Please read over the following fact sheets you were given: IF YOU HAVE QUESTIONS ABOUT YOUR PRE-OP INSTRUCTIONS, PLEASE CALL  [phone].    - Preparing for Surgery Before surgery, you can play an important role.  Because skin is not sterile, your skin needs to be as free of germs as possible.  You can reduce the number of germs on your skin by washing with CHG (chlorahexidine gluconate) soap before surgery.  CHG is an antiseptic cleaner which kills germs and bonds with the skin to continue killing germs even after washing. Please DO NOT use if you have an allergy to CHG or antibacterial soaps.  If your skin becomes reddened/irritated stop using the CHG and inform your nurse when you arrive at Short Stay. Do not shave (including legs and underarms) for at least 48 hours prior to the first CHG shower.  You may shave your face/neck.  Please follow these instructions carefully:  1.  Shower with CHG Soap the night before surgery and the  morning of surgery.  2.  If you choose to wash your hair, wash your hair first as usual with your normal  shampoo.  3.  After you shampoo, rinse your hair and body thoroughly to remove the shampoo.                             4.  Use CHG as you would any other liquid soap.  You can apply chg directly to the skin and wash.  Gently with a scrungie or clean washcloth.  5.  Apply the CHG Soap to your body ONLY FROM THE NECK DOWN.   Do not use on face/ open                           Wound or open sores. Avoid contact with eyes, ears mouth and genitals (private parts).                       Wash face,  Genitals (private parts) with your normal soap.             6.  Wash thoroughly, paying special attention to the area where your  surgery  will be performed.  7.  Thoroughly rinse your body with warm water from the neck down.  8.  DO NOT shower/wash with your normal soap after using and rinsing off the CHG Soap.            9.  Pat yourself dry with a clean towel.            10.  Wear clean pajamas.            11.  Place clean sheets on your bed the night of your first shower and do  not  sleep with pets.  ON THE DAY OF SURGERY : Do not apply any lotions/deodorants the morning of surgery.  Please wear clean clothes to the hospital/surgery center.    FAILURE TO FOLLOW THESE INSTRUCTIONS MAY RESULT IN THE CANCELLATION OF YOUR SURGERY  PATIENT SIGNATURE_________________________________  NURSE SIGNATURE__________________________________  ________________________________________________________________________

## 2023-10-26 NOTE — Progress Notes (Signed)
 COVID Vaccine received:  []  No [x]  Yes Date of any COVID positive Test in last 90 days:  PCP - Lavell Luster, MD at [facility] Cardiologist - Dr. Daryel November in [place] [facility]- Lacey Jensen- Dellie Burns, MD at [facility] Neurology in [facility]  Chest x-ray -  EKG -  2012  Epic   will repeat Stress Test -  ECHO -  Cardiac Cath -  x2  (2012 stents x 2 done in [place] and 2015 no stents done in [place])     Per [facility] notes: S/p cardiac cath 10/2010 w/ Xience V DES 3.5 x 12 mm to Prox LAD and Xience Nano DES 2.25x12 mm to distal L Cx    Pacemaker / ICD device [x]  No []  Yes   Spinal Cord Stimulator:[x]  No []  Yes       History of Sleep Apnea? []  No [x]  Yes  none since gastric bypass.  CPAP used?- [x]  No []  Yes    Does the patient monitor blood sugar?   []  N/A   []  No [x]  Yes  Patient has: []  NO Hx DM   []  Pre-DM   []  DM1  [x]   DM2 Last A1c was: 7.0  on  03-19-2017  Epic    Does patient have a [employer] or Dexcom? [x]  No []  Yes   Fasting Blood Sugar Ranges-  Checks Blood Sugar _2 times a week  METFORMIN- 1000mg  q am-   Hold DOS  Blood Thinner / Instructions:  None Aspirin Instructions:  ASA 81 mg   hold 5-7 days    ERAS Protocol Ordered: [x]  No  []  Yes Patient is to be NPO after: midnight  Dental hx: []  Dentures:  []  N/A      []  Bridge or Partial: 3  permanent bridges                  []  Loose or Damaged teeth:   Activity level: Patient is able climb a flight of stairs without difficulty; [x]  No CP  [x]  No SOB. Patient can perform ADLs without assistance.   Anesthesia review: CAD- hx MI 2015 (LHC x 2), murmur, DM2, HTN, PONV, OSA-   , asthma, s/p Roux-N-Y gastric bypass 2018, GERD, anemia  Patient denies shortness of breath, fever, cough and chest pain at PAT appointment.  Patient verbalized understanding and agreement to the Pre-Surgical Instructions that were given to them at this PAT appointment. Patient was also educated of the need to review these PAT instructions  again prior to her surgery.I reviewed the appropriate phone numbers to call if they have any and questions or concerns.

## 2023-10-27 ENCOUNTER — Other Ambulatory Visit: Payer: Self-pay

## 2023-10-27 ENCOUNTER — Encounter (HOSPITAL_COMMUNITY)
Admission: RE | Admit: 2023-10-27 | Discharge: 2023-10-27 | Disposition: A | Discharge status: Home / Self Care | Source: Ambulatory Visit | Attending: Urology | Admitting: Urology

## 2023-10-27 ENCOUNTER — Encounter (HOSPITAL_COMMUNITY): Payer: Self-pay

## 2023-10-27 VITALS — BP 162/72 | HR 97 | Temp 97.9°F | Resp 18 | Ht 64.0 in | Wt 199.2 lb

## 2023-10-27 DIAGNOSIS — E785 Hyperlipidemia, unspecified: Secondary | ICD-10-CM | POA: Diagnosis not present

## 2023-10-27 DIAGNOSIS — Z01818 Encounter for other preprocedural examination: Secondary | ICD-10-CM | POA: Insufficient documentation

## 2023-10-27 DIAGNOSIS — J45909 Unspecified asthma, uncomplicated: Secondary | ICD-10-CM | POA: Insufficient documentation

## 2023-10-27 DIAGNOSIS — E119 Type 2 diabetes mellitus without complications: Secondary | ICD-10-CM | POA: Diagnosis not present

## 2023-10-27 DIAGNOSIS — I252 Old myocardial infarction: Secondary | ICD-10-CM | POA: Insufficient documentation

## 2023-10-27 DIAGNOSIS — I2510 Atherosclerotic heart disease of native coronary artery without angina pectoris: Secondary | ICD-10-CM | POA: Diagnosis not present

## 2023-10-27 DIAGNOSIS — I10 Essential (primary) hypertension: Secondary | ICD-10-CM | POA: Diagnosis not present

## 2023-10-27 HISTORY — DX: Pneumonia, unspecified organism: J18.9

## 2023-10-27 LAB — CBC
HCT: 40.7 % (ref 36.0–46.0)
Hemoglobin: 12.7 g/dL (ref 12.0–15.0)
MCH: 26.7 pg (ref 26.0–34.0)
MCHC: 31.2 g/dL (ref 30.0–36.0)
MCV: 85.5 fL (ref 80.0–100.0)
Platelets: 151 10*3/uL (ref 150–400)
RBC: 4.76 MIL/uL (ref 3.87–5.11)
RDW: 14.2 % (ref 11.5–15.5)
WBC: 6.5 10*3/uL (ref 4.0–10.5)
nRBC: 0 % (ref 0.0–0.2)

## 2023-10-27 LAB — HEMOGLOBIN A1C
Hgb A1c MFr Bld: 6.8 % — ABNORMAL HIGH (ref 4.8–5.6)
Mean Plasma Glucose: 148 mg/dL

## 2023-10-27 LAB — BASIC METABOLIC PANEL WITH GFR
Anion gap: 9 (ref 5–15)
BUN: 15 mg/dL (ref 8–23)
CO2: 25 mmol/L (ref 22–32)
Calcium: 9 mg/dL (ref 8.9–10.3)
Chloride: 105 mmol/L (ref 98–111)
Creatinine, Ser: 0.88 mg/dL (ref 0.44–1.00)
GFR, Estimated: >60 mL/min (ref >60)
Glucose, Bld: 156 mg/dL — ABNORMAL HIGH (ref 70–99)
Potassium: 3.3 mmol/L — ABNORMAL LOW (ref 3.5–5.1)
Sodium: 139 mmol/L (ref 135–145)

## 2023-10-27 LAB — GLUCOSE, CAPILLARY: Glucose-Capillary: 147 mg/dL — ABNORMAL HIGH (ref 70–99)

## 2023-10-28 NOTE — Progress Notes (Signed)
 Entered in error

## 2023-10-29 NOTE — Progress Notes (Signed)
 Anesthesia Chart Review:  68 year old female with pertinent history including PONV, HTN, HLD, headaches, asthma, GERD on PPI, non-insulin-dependent DM2, prior OSA (reports resolved after weight loss), s/p Roux-en-Y gastric bypass, CAD s/p MI 2015 treated with stent x 2.  Cardiac cath in [place], [place] 04/25/2014 showed patent LAD stent with 20 to 30% in-stent restenosis, patent distal circumflex stent, 30% stenosis in the midportion of the RCA, otherwise no significant disease.  Patient was last seen by cardiologist Dr. Daryel November on 06/27/2021, no cardiac concerns at that time.  Patient states she is able to go up a flight of stairs without chest pain or shortness of breath.  Reviewed history anesthesiologist Dr. Glade Stanford.  He advised that given good functional status, patient can proceed as planned barring acute status change.  Preop labs reviewed, mild hypokalemia potassium 3.3, DM2 well controlled A1c 6.8.  EKG 10/27/2023: Normal sinus rhythm.  Rate 66. Nonspecific ST and T wave abnormality  Cath 04/25/2014 [facility]): Findings: 1.  Left main coronary artery bifurcates, free of angiographically significant disease. 2.  LAD: The LAD is a large vessel, wraps around and supplies the apex.  There is a patent stent in the proximal portion of the vessel with a 20% to 30% in-stent restenosis. 3.  Circumflex: The circumflex is a large codominant vessel.  There is a patent stent in the distal portion of the vessel. 4.  Right coronary artery is a codominant vessel.  There is 30% stenosis in the midportion of the vessel. 5.  LVEDP is 24 mmHg. 6.  There is no stenosis on pullback across the aortic valve.    Zannie Cove [facility] Short Stay Center/Anesthesiology Phone [phone] 10/29/2023 3:00 PM

## 2023-10-29 NOTE — Anesthesia Preprocedure Evaluation (Addendum)
 Anesthesia Evaluation  Patient identified by MRN, date of birth, ID band Patient awake    Reviewed: Allergy & Precautions, H&P , NPO status , Patient's Chart, lab work & pertinent test results  History of Anesthesia Complications (+) PONV and history of anesthetic complications  Airway Mallampati: II  TM Distance: >3 FB Neck ROM: Full    Dental no notable dental hx. (+) Dental Advisory Given   Pulmonary asthma , sleep apnea , former smoker   Pulmonary exam normal breath sounds clear to auscultation       Cardiovascular hypertension, + CAD, + Past MI and + Cardiac Stents  Normal cardiovascular exam Rhythm:Regular Rate:Normal     Neuro/Psych  Headaches  negative psych ROS   GI/Hepatic Neg liver ROS,GERD  ,,Hx of gastric bypass   Endo/Other  diabetes, Type 2    Renal/GU negative Renal ROS  negative genitourinary   Musculoskeletal  (+) Arthritis ,    Abdominal   Peds negative pediatric ROS (+)  Hematology  (+) Blood dyscrasia, anemia   Anesthesia Other Findings   Reproductive/Obstetrics negative OB ROS                             Anesthesia Physical Anesthesia Plan  ASA: 3  Anesthesia Plan: General   Post-op Pain Management: Tylenol PO (pre-op)*   Induction: Intravenous  PONV Risk Score and Plan: 4 or greater and Ondansetron, Dexamethasone, Midazolam and Treatment may vary due to age or medical condition  Airway Management Planned: LMA  Additional Equipment:   Intra-op Plan:   Post-operative Plan: Extubation in OR  Informed Consent: I have reviewed the patients History and Physical, chart, labs and discussed the procedure including the risks, benefits and alternatives for the proposed anesthesia with the patient or authorized representative who has indicated his/her understanding and acceptance.     Dental advisory given  Plan Discussed with: CRNA  Anesthesia Plan  Comments: (PAT note by Rudy Costain, PA-C: 68 year old female with pertinent history including PONV, HTN, HLD, headaches, asthma, GERD on PPI, non-insulin-dependent DM2, prior OSA (reports resolved after weight loss), s/p Roux-en-Y gastric bypass, CAD s/p MI 2015 treated with stent x 2.  Cardiac cath in [place], [place] 04/25/2014 showed patent LAD stent with 20 to 30% in-stent restenosis, patent distal circumflex stent, 30% stenosis in the midportion of the RCA, otherwise no significant disease.  Patient was last seen by cardiologist Dr. Treasa Friend on 06/27/2021, no cardiac concerns at that time.  Patient states she is able to go up a flight of stairs without chest pain or shortness of breath.  Reviewed history anesthesiologist Dr. Noreene Bearded.  He advised that given good functional status, patient can proceed as planned barring acute status change.  Preop labs reviewed, mild hypokalemia potassium 3.3, DM2 well controlled A1c 6.8.  EKG 10/27/2023: Normal sinus rhythm.  Rate 66. Nonspecific ST and T wave abnormality  Cath 04/25/2014 [facility]): Findings: 1.  Left main coronary artery bifurcates, free of angiographically significant disease. 2.  LAD: The LAD is a large vessel, wraps around and supplies the apex.  There is a patent stent in the proximal portion of the vessel with a 20% to 30% in-stent restenosis. 3.  Circumflex: The circumflex is a large codominant vessel.  There is a patent stent in the distal portion of the vessel. 4.  Right coronary artery is a codominant vessel.  There is 30% stenosis in the midportion of the  vessel. 5.  LVEDP is 24 mmHg. 6.  There is no stenosis on pullback across the aortic valve.  )        Anesthesia Quick Evaluation

## 2023-11-03 NOTE — H&P (Signed)
 CC/HPI: cc: Urolithiasis   09/22/2023: 68 year old woman with a history of gastric bypass and urolithiasis presenting to the ED with acute onset left flank pain, nausea and vomiting found to have a 4 mm left proximal ureteral calculus. She has passed multiple stones on her own. CT scan showed a 4 mm proximal ureteral calculus with mild hydronephrosis as well as a 2 cm right renal pelvis calculus without hydronephrosis. Her pain is controlled. She works as an Public house manager. No fevers or chills. She has never required surgical intervention for stones before.     ALLERGIES: Bell Pe[place] Cat Dander Erythromycin    MEDICATIONS: Hydrochlorothiazide  Metformin Hcl 1,000 mg tablet  Metoprolol Succinate 25 mg tablet, extended release 24 hr  Albuterol Sulfate Hfa 90 mcg hfa aerosol with adapter  Amitriptyline Hcl 25 mg tablet  Aspirin Ec 81 mg tablet, delayed release  Citracal-D3  Enalapril Maleate 5 mg tablet  Hydrocodone-Acetaminophen 5 mg-325 mg tablet  Januvia 100 mg tablet  Lovenox 30 mg/0.3 ml syringe  Lyrica 150 mg capsule  Montelukast Sodium 10 mg tablet  Multivitamin  Nasacort 55 mcg aerosol, spray  Nitroglycerin 0.4 mg tablet, sublingual  Omega 3  Oxycodone Hcl 5 mg tablet  Pantoprazole Sodium 40 mg tablet, delayed release  Rizatriptan 10 mg tablet  Rosuvastatin Calcium 40 mg tablet  Symbicort 160 mcg-4.5 mcg/actuation hfa aerosol with adapter  Tylenol Extra Strength 500 mg tablet  Vitamin D2 1,250 mcg (50,000 unit) capsule  Zolpidem Tartrate 10 mg tablet     GU PSH: None     PSH Notes: Bladder tack 2003  Patella realignment    NON-GU PSH: Breast Reduction Gastric bypass - about 2019 Hysterectomy Knee replacement Remove Gallbladder     GU PMH: None     PMH Notes: Kidney stones     NON-GU PMH: Arthritis Asthma Diabetes Type 2 Hypercholesterolemia Hypertension    FAMILY HISTORY: Breast Cancer - Mother, Grandmother, Grandfather Diabetes - Mother Kidney Failure -  Mother Strokes - Sister   SOCIAL HISTORY: Marital Status: Married Preferred Language: English; Ethnicity: Not Hispanic Or Latino; Race: White Current Smoking Status: Patient does not smoke anymore.   Tobacco Use Assessment Completed: Used Tobacco in last 30 days? Does not drink anymore.  Drinks 2 caffeinated drinks per day.    REVIEW OF SYSTEMS:    GU Review Female:   Patient reports hard to postpone urination, burning /pain with urination, and leakage of urine. Patient denies frequent urination, get up at night to urinate, stream starts and stops, trouble starting your stream, have to strain to urinate, and being pregnant.  Gastrointestinal (Upper):   Patient reports nausea and vomiting. Patient denies indigestion/ heartburn.  Gastrointestinal (Lower):   Patient denies diarrhea and constipation.  Constitutional:   Patient denies fever, night sweats, weight loss, and fatigue.  Skin:   Patient denies skin rash/ lesion and itching.  Eyes:   Patient denies blurred vision and double vision.  Ears/ Nose/ Throat:   Patient reports sore throat and sinus problems.   Hematologic/Lymphatic:   Patient reports easy bruising. Patient denies swollen glands.  Cardiovascular:   Patient denies leg swelling and chest pains.  Respiratory:   Patient reports cough. Patient denies shortness of breath.  Endocrine:   Patient denies excessive thirst.  Musculoskeletal:   Patient reports joint pain. Patient denies back pain.  Neurological:   Patient reports headaches and dizziness.   Psychologic:   Patient denies depression and anxiety.   VITAL SIGNS:  09/22/2023 09:21 AM  Weight 204 lb / 92.53 kg  Height 64 in / 162.56 cm  BP 131/71 mmHg  Pulse 56 /min  Temperature 97.3 F / 36.2 C  BMI 35.0 kg/m   MULTI-SYSTEM PHYSICAL EXAMINATION:    Constitutional: Well-nourished. No physical deformities. Normally developed. Good grooming.  Neck: Neck symmetrical, not swollen. Normal tracheal position.   Respiratory: No labored breathing, no use of accessory muscles.   Skin: No paleness, no jaundice, no cyanosis. No lesion, no ulcer, no rash.  Neurologic / Psychiatric: Oriented to time, oriented to place, oriented to person. No depression, no anxiety, no agitation.  Eyes: Normal conjunctivae. Normal eyelids.  Ears, Nose, Mouth, and Throat: Left ear no scars, no lesions, no masses. Right ear no scars, no lesions, no masses. Nose no scars, no lesions, no masses. Normal hearing. Normal lips.  Musculoskeletal: Normal gait and station of head and neck.     Complexity of Data:  Records Review:   Previous Patient Records, POC Tool  Urine Test Review:   Urinalysis  X-Ray Review: C.T. Pelvis w/o Contrast: Discussed With Radiologist.  C.T. Abdomen/Pelvis: Reviewed Films. Reviewed Report. Discussed With Patient. IMPRESSION:  1. 4 mm calculus in the left proximal ureter, slightly distal to the  left ureteropelvic junction, with mild left pelvocalyceal dilatation  and left periureteral stranding.  2. 2.2 x 1.1 cm calculus at the right ureteropelvic junction without  evidence of hydronephrosis.  3. 3 mm calculus in the bladder.  4. Aortic Atherosclerosis (ICD10-I70.0). Atherosclerotic  calcification contributes to at least mild narrowing at the origin  of the SMA.    Electronically Signed  By: Hart Robinsons M.D.  On: 09/20/2023 19:05    PROCEDURES:          Urinalysis w/Scope Dipstick Dipstick Cont'd Micro  Color: Yellow Bilirubin: Neg mg/dL WBC/hpf: 6 - 91/YNW  Appearance: Slightly Cloudy Ketones: Neg mg/dL RBC/hpf: 3 - 29/FAO  Specific Gravity: 1.015 Blood: 2+ ery/uL Bacteria: Mod (26-50/hpf)  pH: <=5.0 Protein: Trace mg/dL Cystals: Ca Oxalate  Glucose: Neg mg/dL Urobilinogen: 0.2 mg/dL Casts: NS (Not Seen)    Nitrites: Neg Trichomonas: Not Present    Leukocyte Esterase: 2+ leu/uL Mucous: Not Present      Epithelial Cells: 0 - 5/hpf      Yeast: NS (Not Seen)      Sperm: Not Present          Ceftriaxone 1g - 13086, V7846 1 gram was given and zero was wasted.   Qty: 1 Adm. By: Andree Moro  Unit: gram Lot No [id number]  Route: IM Exp. Date 10/18/2025  Freq: None Mfgr.:   Site: Right Hip         Ketoralac 60mg  - 96372, [id number] 60 mg was given and zero was wasted.    Qty: 60 Adm. By: Andree Moro  Unit: mg Lot No [id number]  Route: IM Exp. Date 10/18/2024  Freq: None Mfgr.:   Site: Right Hip         Morphine 4mg  - J2270, 96372 4 MG Given 0 wasted   Qty: 4 Adm. By: Roanna Banning  Unit: mg Lot No [id number]  Route: IM Exp. Date 07/20/2024  Freq: None Mfgr.:   Site: Left Buttock   ASSESSMENT:      ICD-10 Details  1 GU:   Renal calculus - N20.0 Chronic, Stable  2   Ureteral calculus - N20.1 Acute, Systemic Symptoms   PLAN:            Medications  New Meds: Bactrim Ds 800 mg-160 mg tablet 1 tablet PO BID   #6  0 Refill(s)  Pharmacy Name:  [facility] [phone]  Address:  [address]   [place], [place] [address]  Phone:  [phone]  Fax:  [phone]            Orders Labs CULTURE, URINE          Schedule Return Visit/Planned Activity: 1 Week - Extender          Document Letter(s):  Created for Patient: Clinical Summary         Notes:   1. Urolithiasis:   Left ureteral calculus-patient would like to try and pass the stone which is reasonable based on size. She was given Toradol, ceftriaxone and morphine in the office today. She is going to pick up her Flomax and pain medication at the pharmacy as well as an antibiotic today. We discussed return precautions including fever, chills, intolerable pain and inability to tolerate p.o. She will follow-up in 1 week.   Right renal calculus-patient has a large right renal pelvis calculus. Will need surgical intervention for this. Will discussed ureteroscopy versus PCNL.

## 2023-11-04 ENCOUNTER — Encounter (HOSPITAL_COMMUNITY)
Admission: RE | Disposition: A | Discharge status: Home / Self Care | Payer: Self-pay | Source: Home / Self Care | Attending: Urology

## 2023-11-04 ENCOUNTER — Ambulatory Visit (HOSPITAL_COMMUNITY)
Admission: RE | Admit: 2023-11-04 | Discharge: 2023-11-04 | Disposition: A | Discharge status: Home / Self Care | Attending: Urology | Admitting: Urology

## 2023-11-04 ENCOUNTER — Ambulatory Visit (HOSPITAL_COMMUNITY): Payer: Self-pay | Admitting: Physician Assistant

## 2023-11-04 ENCOUNTER — Encounter (HOSPITAL_COMMUNITY): Payer: Self-pay | Admitting: Urology

## 2023-11-04 ENCOUNTER — Other Ambulatory Visit: Payer: Self-pay

## 2023-11-04 ENCOUNTER — Ambulatory Visit (HOSPITAL_BASED_OUTPATIENT_CLINIC_OR_DEPARTMENT_OTHER): Payer: Self-pay | Admitting: Anesthesiology

## 2023-11-04 ENCOUNTER — Ambulatory Visit (HOSPITAL_COMMUNITY)

## 2023-11-04 DIAGNOSIS — G4730 Sleep apnea, unspecified: Secondary | ICD-10-CM | POA: Diagnosis not present

## 2023-11-04 DIAGNOSIS — Z7951 Long term (current) use of inhaled steroids: Secondary | ICD-10-CM | POA: Insufficient documentation

## 2023-11-04 DIAGNOSIS — I2510 Atherosclerotic heart disease of native coronary artery without angina pectoris: Secondary | ICD-10-CM | POA: Diagnosis not present

## 2023-11-04 DIAGNOSIS — Z7982 Long term (current) use of aspirin: Secondary | ICD-10-CM | POA: Diagnosis not present

## 2023-11-04 DIAGNOSIS — M1990 Unspecified osteoarthritis, unspecified site: Secondary | ICD-10-CM | POA: Diagnosis not present

## 2023-11-04 DIAGNOSIS — D649 Anemia, unspecified: Secondary | ICD-10-CM | POA: Insufficient documentation

## 2023-11-04 DIAGNOSIS — N201 Calculus of ureter: Secondary | ICD-10-CM | POA: Diagnosis present

## 2023-11-04 DIAGNOSIS — Z955 Presence of coronary angioplasty implant and graft: Secondary | ICD-10-CM | POA: Diagnosis not present

## 2023-11-04 DIAGNOSIS — I252 Old myocardial infarction: Secondary | ICD-10-CM | POA: Diagnosis not present

## 2023-11-04 DIAGNOSIS — K219 Gastro-esophageal reflux disease without esophagitis: Secondary | ICD-10-CM | POA: Insufficient documentation

## 2023-11-04 DIAGNOSIS — E119 Type 2 diabetes mellitus without complications: Secondary | ICD-10-CM | POA: Diagnosis not present

## 2023-11-04 DIAGNOSIS — I10 Essential (primary) hypertension: Secondary | ICD-10-CM

## 2023-11-04 DIAGNOSIS — Z79899 Other long term (current) drug therapy: Secondary | ICD-10-CM | POA: Diagnosis not present

## 2023-11-04 DIAGNOSIS — Z01818 Encounter for other preprocedural examination: Secondary | ICD-10-CM

## 2023-11-04 DIAGNOSIS — J45909 Unspecified asthma, uncomplicated: Secondary | ICD-10-CM | POA: Insufficient documentation

## 2023-11-04 DIAGNOSIS — N202 Calculus of kidney with calculus of ureter: Secondary | ICD-10-CM | POA: Diagnosis not present

## 2023-11-04 DIAGNOSIS — Z7984 Long term (current) use of oral hypoglycemic drugs: Secondary | ICD-10-CM | POA: Diagnosis not present

## 2023-11-04 DIAGNOSIS — N132 Hydronephrosis with renal and ureteral calculous obstruction: Secondary | ICD-10-CM | POA: Diagnosis not present

## 2023-11-04 DIAGNOSIS — Z87891 Personal history of nicotine dependence: Secondary | ICD-10-CM | POA: Insufficient documentation

## 2023-11-04 DIAGNOSIS — Z9884 Bariatric surgery status: Secondary | ICD-10-CM | POA: Diagnosis not present

## 2023-11-04 DIAGNOSIS — R519 Headache, unspecified: Secondary | ICD-10-CM | POA: Insufficient documentation

## 2023-11-04 HISTORY — PX: CYSTOSCOPY W/ RETROGRADES: SHX1426

## 2023-11-04 HISTORY — PX: CYSTOSCOPY/URETEROSCOPY/HOLMIUM LASER/STENT PLACEMENT: SHX6546

## 2023-11-04 LAB — GLUCOSE, CAPILLARY
Glucose-Capillary: 132 mg/dL — ABNORMAL HIGH (ref 70–99)
Glucose-Capillary: 153 mg/dL — ABNORMAL HIGH (ref 70–99)

## 2023-11-04 SURGERY — CYSTOSCOPY/URETEROSCOPY/HOLMIUM LASER/STENT PLACEMENT
Anesthesia: General | Laterality: Bilateral

## 2023-11-04 MED ORDER — 0.9 % SODIUM CHLORIDE (POUR BTL) OPTIME
TOPICAL | Status: DC | PRN
  Administered 2023-11-04: 1000 mL

## 2023-11-04 MED ORDER — DEXAMETHASONE SODIUM PHOSPHATE 10 MG/ML IJ SOLN
INTRAMUSCULAR | Status: DC | PRN
  Administered 2023-11-04: 5 mg via INTRAVENOUS

## 2023-11-04 MED ORDER — LIDOCAINE HCL (PF) 2 % IJ SOLN
INTRAMUSCULAR | Status: AC
  Filled 2023-11-04: qty 5

## 2023-11-04 MED ORDER — ACETAMINOPHEN 10 MG/ML IV SOLN
INTRAVENOUS | Status: AC
  Filled 2023-11-04: qty 100

## 2023-11-04 MED ORDER — DROPERIDOL 2.5 MG/ML IJ SOLN
0.6250 mg | Freq: Once | INTRAMUSCULAR | Status: AC | PRN
  Administered 2023-11-04: 0.625 mg via INTRAVENOUS

## 2023-11-04 MED ORDER — CEFAZOLIN SODIUM-DEXTROSE 2-4 GM/100ML-% IV SOLN
2.0000 g | INTRAVENOUS | Status: AC
  Administered 2023-11-04: 2 g via INTRAVENOUS

## 2023-11-04 MED ORDER — DROPERIDOL 2.5 MG/ML IJ SOLN
INTRAMUSCULAR | Status: DC
Start: 2023-11-04 — End: 2023-11-04
  Filled 2023-11-04: qty 2

## 2023-11-04 MED ORDER — FENTANYL CITRATE PF 50 MCG/ML IJ SOSY
PREFILLED_SYRINGE | INTRAMUSCULAR | Status: AC
  Filled 2023-11-04: qty 1

## 2023-11-04 MED ORDER — CHLORHEXIDINE GLUCONATE 0.12 % MT SOLN
15.0000 mL | Freq: Once | OROMUCOSAL | Status: AC
  Administered 2023-11-04: 15 mL via OROMUCOSAL

## 2023-11-04 MED ORDER — MIDAZOLAM HCL 5 MG/5ML IJ SOLN
INTRAMUSCULAR | Status: DC | PRN
  Administered 2023-11-04: 2 mg via INTRAVENOUS

## 2023-11-04 MED ORDER — SODIUM CHLORIDE 0.9 % IR SOLN
Status: DC | PRN
  Administered 2023-11-04: 3000 mL via INTRAVESICAL

## 2023-11-04 MED ORDER — OXYCODONE HCL 5 MG PO TABS
5.0000 mg | ORAL_TABLET | Freq: Once | ORAL | Status: AC | PRN
  Administered 2023-11-04: 5 mg via ORAL

## 2023-11-04 MED ORDER — TAMSULOSIN HCL 0.4 MG PO CAPS: 0.4000 mg | ORAL_CAPSULE | Freq: Every day | ORAL | 0 refills | Status: AC

## 2023-11-04 MED ORDER — OXYCODONE HCL 5 MG PO TABS: 5.0000 mg | ORAL_TABLET | Freq: Three times a day (TID) | ORAL | 0 refills | Status: AC | PRN

## 2023-11-04 MED ORDER — FENTANYL CITRATE (PF) 100 MCG/2ML IJ SOLN
INTRAMUSCULAR | Status: AC
  Filled 2023-11-04: qty 2

## 2023-11-04 MED ORDER — FENTANYL CITRATE (PF) 100 MCG/2ML IJ SOLN
INTRAMUSCULAR | Status: DC | PRN
  Administered 2023-11-04 (×2): 25 ug via INTRAVENOUS
  Administered 2023-11-04: 50 ug via INTRAVENOUS

## 2023-11-04 MED ORDER — INSULIN ASPART 100 UNIT/ML IJ SOLN: 0.0000 [IU] | INTRAMUSCULAR | Status: DC | PRN

## 2023-11-04 MED ORDER — OXYCODONE HCL 5 MG/5ML PO SOLN: 5.0000 mg | Freq: Once | ORAL | Status: AC | PRN

## 2023-11-04 MED ORDER — ONDANSETRON HCL 4 MG/2ML IJ SOLN
INTRAMUSCULAR | Status: DC | PRN
  Administered 2023-11-04: 4 mg via INTRAVENOUS

## 2023-11-04 MED ORDER — IOHEXOL 300 MG/ML  SOLN
INTRAMUSCULAR | Status: DC | PRN
  Administered 2023-11-04: 13 mL

## 2023-11-04 MED ORDER — HYDROMORPHONE HCL 1 MG/ML IJ SOLN
INTRAMUSCULAR | Status: DC
Start: 2023-11-04 — End: 2023-11-04
  Filled 2023-11-04: qty 2

## 2023-11-04 MED ORDER — MIDAZOLAM HCL 2 MG/2ML IJ SOLN
INTRAMUSCULAR | Status: AC
  Filled 2023-11-04: qty 2

## 2023-11-04 MED ORDER — LACTATED RINGERS IV SOLN: INTRAVENOUS | Status: DC

## 2023-11-04 MED ORDER — PROPOFOL 10 MG/ML IV BOLUS
INTRAVENOUS | Status: AC
  Filled 2023-11-04: qty 20

## 2023-11-04 MED ORDER — PROPOFOL 10 MG/ML IV BOLUS
INTRAVENOUS | Status: DC | PRN
  Administered 2023-11-04: 200 mg via INTRAVENOUS

## 2023-11-04 MED ORDER — OXYCODONE HCL 5 MG PO TABS
ORAL_TABLET | ORAL | Status: AC
  Filled 2023-11-04: qty 1

## 2023-11-04 MED ORDER — FENTANYL CITRATE PF 50 MCG/ML IJ SOSY
25.0000 ug | PREFILLED_SYRINGE | INTRAMUSCULAR | Status: DC | PRN
  Administered 2023-11-04 (×3): 50 ug via INTRAVENOUS

## 2023-11-04 MED ORDER — FENTANYL CITRATE PF 50 MCG/ML IJ SOSY
PREFILLED_SYRINGE | INTRAMUSCULAR | Status: AC
  Filled 2023-11-04: qty 2

## 2023-11-04 MED ORDER — LIDOCAINE HCL (PF) 2 % IJ SOLN
INTRAMUSCULAR | Status: DC | PRN
  Administered 2023-11-04: 100 mg via INTRADERMAL

## 2023-11-04 MED ORDER — ORAL CARE MOUTH RINSE: 15.0000 mL | Freq: Once | OROMUCOSAL | Status: AC

## 2023-11-04 MED ORDER — ONDANSETRON HCL 4 MG/2ML IJ SOLN
INTRAMUSCULAR | Status: AC
  Filled 2023-11-04: qty 2

## 2023-11-04 MED ORDER — HYOSCYAMINE SULFATE 0.125 MG PO TBDP: 0.1250 mg | ORAL_TABLET | Freq: Four times a day (QID) | ORAL | 0 refills | Status: AC | PRN

## 2023-11-04 MED ORDER — ACETAMINOPHEN 10 MG/ML IV SOLN
1000.0000 mg | Freq: Once | INTRAVENOUS | Status: DC | PRN
  Administered 2023-11-04: 1000 mg via INTRAVENOUS

## 2023-11-04 MED ORDER — DEXAMETHASONE SODIUM PHOSPHATE 10 MG/ML IJ SOLN
INTRAMUSCULAR | Status: AC
  Filled 2023-11-04: qty 1

## 2023-11-04 MED ORDER — HYDROMORPHONE HCL 1 MG/ML IJ SOLN
0.5000 mg | INTRAMUSCULAR | Status: AC | PRN
Start: 2023-11-04 — End: 2023-11-04
  Administered 2023-11-04 (×4): 0.5 mg via INTRAVENOUS

## 2023-11-04 MED ORDER — CEPHALEXIN 500 MG PO CAPS: 500.0000 mg | ORAL_CAPSULE | Freq: Once | ORAL | 0 refills | Status: AC

## 2023-11-04 SURGICAL SUPPLY — 22 items
BAG URO CATCHER STRL LF (MISCELLANEOUS) ×1 IMPLANT
BASKET ZERO TIP NITINOL 2.4FR (BASKET) IMPLANT
CATH URETL OPEN 5X70 (CATHETERS) ×1 IMPLANT
CATH URETL OPEN END 6FR 70 (CATHETERS) ×1 IMPLANT
CLOTH BEACON ORANGE TIMEOUT ST (SAFETY) ×1 IMPLANT
DRSG TEGADERM 2-3/8X2-3/4 SM (GAUZE/BANDAGES/DRESSINGS) IMPLANT
EXTRACTOR STONE 1.7FRX115CM (UROLOGICAL SUPPLIES) IMPLANT
FIBER LASER MOSES 200 DFL (Laser) IMPLANT
FIBER LASER MOSES 365 DFL (Laser) IMPLANT
GLOVE BIO SURGEON STRL SZ 6.5 (GLOVE) ×1 IMPLANT
GOWN STRL REUS W/ TWL LRG LVL3 (GOWN DISPOSABLE) ×1 IMPLANT
GUIDEWIRE STR DUAL SENSOR (WIRE) ×1 IMPLANT
KIT TURNOVER KIT A (KITS) IMPLANT
LASER FIB FLEXIVA PULSE ID 365 (Laser) IMPLANT
MANIFOLD NEPTUNE II (INSTRUMENTS) ×1 IMPLANT
PACK CYSTO (CUSTOM PROCEDURE TRAY) ×1 IMPLANT
SHEATH NAVIGATOR HD 11/13X28 (SHEATH) IMPLANT
SHEATH NAVIGATOR HD 11/13X36 (SHEATH) IMPLANT
STENT URET 6FRX24 CONTOUR (STENTS) IMPLANT
TRACTIP FLEXIVA PULS ID 200XHI (Laser) IMPLANT
TUBING CONNECTING 10 (TUBING) ×1 IMPLANT
TUBING UROLOGY SET (TUBING) ×1 IMPLANT

## 2023-11-04 NOTE — Op Note (Signed)
 Preoperative diagnosis: left ureteral calculus  Postoperative diagnosis: bilateral renal calculi  Procedure:  Cystoscopy left ureteroscopy with basket stone extraction bilateral retrograde pyelography with interpretation Right ureteroscopy with laser lithotripsy, stone basketing and right ureteral stent 6 French by 24 cm no string  Surgeon: Perley Bradley, MD  Anesthesia: General  Complications: None  Intraoperative findings:  Normal urethra Bilateral orthotropic ureteral orifices left retrograde pyelography demonstrated a dilated ureter to the level of the bladder without any filling defect Right retrograde pyelogram demonstrated normal caliber ureter without filling defect and a large filling defect in the renal pelvis consistent with patient's known 2.2 cm renal calculus Bladder mucosa normal without masses   EBL: Minimal  Specimens: bilateral renal calculi  Disposition of specimens: [facility] for stone analysis  Indication: Abigail Townsend is a 68 y.o.   patient with a 4mm left proximal ureteral calculus and 2.2 cm right renal pelvis calculus and associated symptoms. After reviewing the management options for treatment, the patient elected to proceed with the above surgical procedure(s). We have discussed the potential benefits and risks of the procedure, side effects of the proposed treatment, the likelihood of the patient achieving the goals of the procedure, and any potential problems that might occur during the procedure or recuperation. Informed consent has been obtained.   Description of procedure:  The patient was taken to the operating room and general anesthesia was induced.  The patient was placed in the dorsal lithotomy position, prepped and draped in the usual sterile fashion, and preoperative antibiotics were administered. A preoperative time-out was performed.   Cystourethroscopy was performed.  The patient's urethra was examined and was  normal.  The bladder was then systematically examined in its entirety. There was no evidence for any bladder tumors, stones, or other mucosal pathology.    Attention then turned to the left ureteral orifice and a ureteral catheter was used to intubate the ureteral orifice.  Omnipaque contrast was injected through the ureteral catheter and a retrograde pyelogram was performed with findings as dictated above.  A 0.38 sensor guidewire was then advanced up the left ureter into the renal pelvis under fluoroscopic guidance. The 4.5 Fr semirigid ureteroscope was then advanced into the ureter to the UPJ.  No stone was encountered.  The ureter was noted to be dilated all the way to the level of the bladder consistent with having passed a stone.  A second sensor wire was placed through the semirigid ureteroscope and up to the kidney with fluoroscopic guidance.  The ureteroscope was removed and a ureteral access sheath was placed over the second wire and advanced to the proximal ureter with fluoroscopy.  The inner sheath and wire were removed.  Flexible ureteroscopy then took place.  There is a small stone fragment that was removed with a 0 tip basket.  No other stones were encountered.  The ureteroscope was removed taking care examine the ureter on the way out.  There was no trauma or injury noted to the ureter and it was noted to be dilated all the way down to the bladder.  A stent was not left.  Attention then turned to the right ureteral orifice.  It was intubated with a open-ended ureteral catheter and a retrograde pyelogram was obtained.  Findings noted above.  A sensor wire was placed at the open-ended ureteral catheter and advanced to the kidney with fluoroscopic guidance.  The ureteral catheter was removed.  A second sensor wire was placed alongside the for sensor wire  and secured with a safety wire.  Next a ureteral access sheath was placed over the second wire and advanced to the proximal ureter with 4 scopic  guidance.  The inner sheath and wire removed.  Flexible ureteroscopy then took place.  A large 2.2 cm calculus was seen in the renal pelvis.  The stone was then fragmented with the 365 micron holmium laser fiber.  A 0 tip basket was used to remove the larger stone fragments.  Reinspection of the ureter revealed no remaining visible stones or fragments.  The ureteral access sheath was removed in unison with the ureteroscope.  The wire was then backloaded through the cystoscope and a ureteral stent was advance over the wire using Seldinger technique.  The stent was positioned appropriately under fluoroscopic and cystoscopic guidance.  The wire was then removed with an adequate stent curl noted in the renal pelvis as well as in the bladder.  The bladder was then emptied and the procedure ended.  The patient appeared to tolerate the procedure well and without complications.  The patient was able to be awakened and transferred to the recovery unit in satisfactory condition.   Disposition: No string left on the right ureteral stent.  Will repeat a KUB in 1 to 2 weeks prior to either stent removal or scheduling staged procedure.

## 2023-11-04 NOTE — Interval H&P Note (Signed)
 History and Physical Interval Note: Patient understands this will be a staged procedure. 11/04/2023 9:15 AM  Abigail Townsend  has presented today for surgery, with the diagnosis of BILATERAL RENAL CALCULI.  The various methods of treatment have been discussed with the patient and family. After consideration of risks, benefits and other options for treatment, the patient has consented to  Procedure(s) with comments: CYSTOSCOPY/URETEROSCOPY/HOLMIUM LASER/STENT PLACEMENT (Bilateral) - CYSTOSCOPY/BILATERAL URETEROSCOPY/HOLMIUM LASER LITHOTRIPSY/STENT PLACEMENT/RETROGRADE PYELOGRAM CYSTOSCOPY, WITH RETROGRADE PYELOGRAM (Bilateral) as a surgical intervention.  The patient's history has been reviewed, patient examined, no change in status, stable for surgery.  I have reviewed the patient's chart and labs.  Questions were answered to the patient's satisfaction.     Veva Grimley D Vahe Pienta

## 2023-11-04 NOTE — Discharge Instructions (Addendum)
 DISCHARGE INSTRUCTIONS FOR KIDNEY STONE/URETERAL STENT   MEDICATIONS:  1. Resume all your other meds from home  2. AZO over the counter can help with the burning/stinging when you urinate. 3. Oxycodone is for moderate/severe pain, otherwise taking up to 1000 mg every 6 hours of plainTylenol will help treat your pain.   4.  Hyoscyamine is for bladder spasms and tamsulosin is for stent discomfort.   ACTIVITY:  1. No strenuous activity x 1week  2. No driving while on narcotic pain medications  3. Drink plenty of water  4. Continue to walk at home - you can still get blood clots when you are at home, so keep active, but don't over do it.  5. May return to work/school tomorrow or when you feel ready   BATHING:  1. You can shower and we recommend daily showers  2. You have a string coming from your urethra: The stent string is attached to your ureteral stent. Do not pull on this.   SIGNS/SYMPTOMS TO CALL:  Please call us  if you have a fever greater than 101.5, uncontrolled nausea/vomiting, uncontrolled pain, dizziness, unable to urinate, bloody urine, chest pain, shortness of breath, leg swelling, leg pain, redness around wound, drainage from wound, or any other concerns or questions.   You can reach us  at [phone].   FOLLOW-UP:  1. You have a stent in place.  I would like to get an x-ray in 1 to 2 weeks to decide if you need a second stage procedure.  We will contact you for this appointment.

## 2023-11-04 NOTE — Anesthesia Postprocedure Evaluation (Signed)
 Anesthesia Post Note  Patient: Abigail Townsend  Procedure(s) Performed: CYSTOSCOPY/BILATERAL URETEROSCOPY/LEFT SIDE BASKETING OF STONE, RIGHT LASER LITHOTRIPSY/STENT PLACEMENT (Bilateral) CYSTOSCOPY, WITH RETROGRADE PYELOGRAM (Bilateral)     Patient location during evaluation: PACU Anesthesia Type: General Level of consciousness: awake and alert Pain management: pain level controlled Vital Signs Assessment: post-procedure vital signs reviewed and stable Respiratory status: spontaneous breathing, nonlabored ventilation, respiratory function stable and patient connected to nasal cannula oxygen Cardiovascular status: stable and blood pressure returned to baseline Postop Assessment: no apparent nausea or vomiting Anesthetic complications: no   No notable events documented.  Last Vitals:  Vitals:   11/04/23 1200 11/04/23 1215  BP: (!) 183/98 (!) 178/75  Pulse: 68 77  Resp: 11 13  Temp:    SpO2: 95% 99%    Last Pain:  Vitals:   11/04/23 1218  TempSrc:   PainSc: 9                  [name] R Elby Green

## 2023-11-04 NOTE — Transfer of Care (Signed)
 Immediate Anesthesia Transfer of Care Note  Patient: Abigail Townsend  Procedure(s) Performed: CYSTOSCOPY/BILATERAL URETEROSCOPY/LEFT SIDE BASKETING OF STONE, RIGHT LASER LITHOTRIPSY/STENT PLACEMENT (Bilateral) CYSTOSCOPY, WITH RETROGRADE PYELOGRAM (Bilateral)  Patient Location: PACU  Anesthesia Type:General  Level of Consciousness: sedated  Airway & Oxygen Therapy: Patient Spontanous Breathing and Patient connected to face mask oxygen  Post-op Assessment: Report given to RN and Post -op Vital signs reviewed and stable  Post vital signs: Reviewed and stable  Last Vitals:  Vitals Value Taken Time  BP    Temp    Pulse 77 11/04/23 1120  Resp 16 11/04/23 1120  SpO2 99 % 11/04/23 1120  Vitals shown include unfiled device data.  Last Pain:  Vitals:   11/04/23 0804  TempSrc: Oral  PainSc:          Complications: No notable events documented.

## 2023-11-04 NOTE — Anesthesia Procedure Notes (Signed)
 Procedure Name: LMA Insertion Date/Time: 11/04/2023 9:45 AM  Performed by: Micky Albee, CRNAPre-anesthesia Checklist: Patient identified, Suction available, Emergency Drugs available, Patient being monitored and Timeout performed Patient Re-evaluated:Patient Re-evaluated prior to induction Oxygen Delivery Method: Circle system utilized Preoxygenation: Pre-oxygenation with 100% oxygen Induction Type: IV induction LMA: LMA inserted LMA Size: 4.0 Tube type: Oral Number of attempts: 1 Placement Confirmation: positive ETCO2 and breath sounds checked- equal and bilateral Tube secured with: Tape Dental Injury: Teeth and Oropharynx as per pre-operative assessment

## 2023-11-05 ENCOUNTER — Encounter (HOSPITAL_COMMUNITY): Payer: Self-pay | Admitting: Urology

## 2024-06-09 ENCOUNTER — Emergency Department (HOSPITAL_COMMUNITY)
Admission: EM | Admit: 2024-06-09 | Discharge: 2024-06-09 | Disposition: A | Discharge status: Home / Self Care | Payer: Worker's Compensation | Attending: Emergency Medicine | Admitting: Emergency Medicine

## 2024-06-09 ENCOUNTER — Emergency Department (HOSPITAL_COMMUNITY)

## 2024-06-09 ENCOUNTER — Other Ambulatory Visit: Payer: Self-pay

## 2024-06-09 DIAGNOSIS — I2510 Atherosclerotic heart disease of native coronary artery without angina pectoris: Secondary | ICD-10-CM | POA: Insufficient documentation

## 2024-06-09 DIAGNOSIS — S8992XA Unspecified injury of left lower leg, initial encounter: Secondary | ICD-10-CM | POA: Diagnosis present

## 2024-06-09 DIAGNOSIS — Z7982 Long term (current) use of aspirin: Secondary | ICD-10-CM | POA: Insufficient documentation

## 2024-06-09 DIAGNOSIS — S8392XA Sprain of unspecified site of left knee, initial encounter: Secondary | ICD-10-CM | POA: Insufficient documentation

## 2024-06-09 DIAGNOSIS — S7002XA Contusion of left hip, initial encounter: Secondary | ICD-10-CM | POA: Insufficient documentation

## 2024-06-09 DIAGNOSIS — Y990 Civilian activity done for income or pay: Secondary | ICD-10-CM | POA: Insufficient documentation

## 2024-06-09 DIAGNOSIS — E119 Type 2 diabetes mellitus without complications: Secondary | ICD-10-CM | POA: Diagnosis not present

## 2024-06-09 DIAGNOSIS — M545 Low back pain, unspecified: Secondary | ICD-10-CM | POA: Insufficient documentation

## 2024-06-09 DIAGNOSIS — Z79899 Other long term (current) drug therapy: Secondary | ICD-10-CM | POA: Diagnosis not present

## 2024-06-09 DIAGNOSIS — W010XXA Fall on same level from slipping, tripping and stumbling without subsequent striking against object, initial encounter: Secondary | ICD-10-CM | POA: Diagnosis not present

## 2024-06-09 DIAGNOSIS — Z96652 Presence of left artificial knee joint: Secondary | ICD-10-CM | POA: Diagnosis not present

## 2024-06-09 DIAGNOSIS — W19XXXA Unspecified fall, initial encounter: Secondary | ICD-10-CM

## 2024-06-09 DIAGNOSIS — S7012XA Contusion of left thigh, initial encounter: Secondary | ICD-10-CM | POA: Diagnosis not present

## 2024-06-09 LAB — BASIC METABOLIC PANEL WITH GFR
Anion gap: 10 (ref 5–15)
BUN: 14 mg/dL (ref 8–23)
CO2: 24 mmol/L (ref 22–32)
Calcium: 9 mg/dL (ref 8.9–10.3)
Chloride: 104 mmol/L (ref 98–111)
Creatinine, Ser: 0.89 mg/dL (ref 0.44–1.00)
GFR, Estimated: >60 mL/min (ref >60)
Glucose, Bld: 131 mg/dL — ABNORMAL HIGH (ref 70–99)
Potassium: 3.8 mmol/L (ref 3.5–5.1)
Sodium: 138 mmol/L (ref 135–145)

## 2024-06-09 LAB — CBC WITH DIFFERENTIAL/PLATELET
Abs Immature Granulocytes: 0.03 K/uL (ref 0.00–0.07)
Basophils Absolute: 0.1 K/uL (ref 0.0–0.1)
Basophils Relative: 1 %
Eosinophils Absolute: 0.9 K/uL — ABNORMAL HIGH (ref 0.0–0.5)
Eosinophils Relative: 11 %
HCT: 38.8 % (ref 36.0–46.0)
Hemoglobin: 12.7 g/dL (ref 12.0–15.0)
Immature Granulocytes: 0 %
Lymphocytes Relative: 24 %
Lymphs Abs: 2 K/uL (ref 0.7–4.0)
MCH: 27.9 pg (ref 26.0–34.0)
MCHC: 32.7 g/dL (ref 30.0–36.0)
MCV: 85.1 fL (ref 80.0–100.0)
Monocytes Absolute: 0.4 K/uL (ref 0.1–1.0)
Monocytes Relative: 5 %
Neutro Abs: 4.7 K/uL (ref 1.7–7.7)
Neutrophils Relative %: 59 %
Platelets: 150 K/uL (ref 150–400)
RBC: 4.56 MIL/uL (ref 3.87–5.11)
RDW: 13.5 % (ref 11.5–15.5)
WBC: 8.1 K/uL (ref 4.0–10.5)
nRBC: 0 % (ref 0.0–0.2)

## 2024-06-09 MED ORDER — HYDROCODONE-ACETAMINOPHEN 5-325 MG PO TABS: 2.0000 | ORAL_TABLET | ORAL | 0 refills | Status: AC | PRN

## 2024-06-09 MED ORDER — MORPHINE SULFATE (PF) 4 MG/ML IV SOLN
4.0000 mg | Freq: Once | INTRAVENOUS | Status: AC
  Administered 2024-06-09: 4 mg via INTRAVENOUS
  Filled 2024-06-09: qty 1

## 2024-06-09 MED ORDER — ONDANSETRON HCL 4 MG/2ML IJ SOLN
4.0000 mg | Freq: Once | INTRAMUSCULAR | Status: AC
  Administered 2024-06-09: 4 mg via INTRAVENOUS
  Filled 2024-06-09: qty 2

## 2024-06-09 NOTE — Discharge Instructions (Addendum)
 Discussed, keep on the knee immobilizer until reevaluated by orthopedics.  You have been provided with a referral for orthopedic surgeon, would recommend that you follow-up with them within the next week.  Otherwise continue to use ice on the affected area, using it for 10 to 15 minutes every other hour.  Further, you can use Tylenol  as well as the prescribed hydrocodone  for pain above and beyond what is managed with Tylenol .  Be careful not to exceed 4 g/day of Tylenol  as the medication provided does have acetaminophen  as a component.

## 2024-06-09 NOTE — ED Triage Notes (Signed)
 Patient from work at [employer] where she slipped on some water in a patient's room, falling with L leg twisted behind her. No LOC, did not hit head. Complains of lower back pain and LLE pain.

## 2024-06-09 NOTE — ED Notes (Signed)
 Patient transported to X-ray

## 2024-06-09 NOTE — Progress Notes (Signed)
 Orthopedic Tech Progress Note Patient Details:  Abigail Townsend [birth year] [id number]  Ortho Devices Type of Ortho Device: Knee Immobilizer Ortho Device/Splint Location: LLE Ortho Device/Splint Interventions: Ordered, Application, Adjustment   Post Interventions Patient Tolerated: Fair Instructions Provided: Adjustment of device, Care of device  Janani Chamber F Mikaili Flippin 06/09/2024, 11:34 AM

## 2024-06-09 NOTE — ED Provider Notes (Signed)
  EMERGENCY DEPARTMENT AT [facility] Provider Note   CSN: [id number] Arrival date & time: 06/09/24  9148     Patient presents with: Leg Injury   Abigail Townsend is a 68 y.o. female presents to ED today with primary concern of left hip and thigh pain after a fall at work.  She works as a PUBLIC HOUSE MANAGER at a local [employer] where she slipped and fell on an unseen puddle of water.  Denies striking her head, denies any loss of consciousness.  Does state that her left leg fell beneath her, causing hyperflexion of the left knee.  Pain is localized to the left hip, left knee.  Painful with any movement and any touch to the affected areas.  Also endorses lower back pain.  Previous medical diagnoses of IBS, hyperlipidemia, CAD, type 2 diabetes.  Previous history of left total knee arthroplasty in 2014.   HPI     Prior to Admission medications   Medication Sig Start Date End Date Taking? Authorizing Provider  HYDROcodone -acetaminophen  (NORCO/VICODIN) 5-325 MG tablet Take 2 tablets by mouth every 4 (four) hours as needed. 06/09/24  Yes Myriam Dorn BROCKS, PA  acetaminophen  (TYLENOL ) 500 MG tablet Take 1,000 mg by mouth every 6 (six) hours as needed for moderate pain (pain score 4-6).    [provider]  aspirin  EC 81 MG tablet Take 81 mg by mouth daily.    [provider]  Cholecalciferol  (VITAMIN D ) 2000 UNITS tablet Take 2,000 Units by mouth daily.    [provider]  hyoscyamine  (ANASPAZ ) 0.125 MG TBDP disintergrating tablet Place 1 tablet (0.125 mg total) under the tongue every 6 (six) hours as needed. 11/04/23   Pace, Maryellen D, MD  metFORMIN (GLUCOPHAGE) 1000 MG tablet Take 1,000 mg by mouth daily with breakfast.    [provider]  metoprolol  succinate (TOPROL -XL) 25 MG 24 hr tablet Take 25 mg by mouth daily.    [provider]  Multiple Vitamin (MULTIVITAMIN WITH MINERALS) TABS tablet Take 1 tablet by mouth daily.    [provider]  nitroGLYCERIN  (NITROSTAT ) 0.4 MG SL tablet Place 0.4 mg under the tongue every 5 (five) minutes as needed for chest pain.    [provider]  ondansetron  (ZOFRAN -ODT) 4 MG disintegrating tablet 4mg  ODT q4 hours prn nausea/vomit Patient not taking: Reported on 10/22/2023 09/20/23   Patt Alm Macho, MD  oxyCODONE  (ROXICODONE ) 5 MG immediate release tablet Take 1 tablet (5 mg total) by mouth every 8 (eight) hours as needed. 11/04/23 11/03/24  Pace, Maryellen D, MD  rosuvastatin  (CRESTOR ) 40 MG tablet Take 40 mg by mouth at bedtime.    [provider]  tamsulosin  (FLOMAX ) 0.4 MG CAPS capsule Take 1 capsule (0.4 mg total) by mouth daily. Patient not taking: Reported on 10/22/2023 09/20/23   Patt Alm Macho, MD  tamsulosin  (FLOMAX ) 0.4 MG CAPS capsule Take 1 capsule (0.4 mg total) by mouth at bedtime. 11/04/23   Pace, Maryellen D, MD  triamcinolone (NASACORT AQ) 55 MCG/ACT nasal inhaler Place 2 sprays into the nose at bedtime as needed (allergies).    [provider]    Allergies: Cat dander, Erythromycin, and Other    Review of Systems  Updated Vital Signs BP 126/75 (BP Location: Right Arm)   Pulse 72   Temp 97.9 F (36.6 C) (Oral)   Resp 16   SpO2 100%   Physical Exam Vitals and nursing note reviewed.  Constitutional:      General:  She is not in acute distress.    Appearance: Normal appearance.  HENT:     Head: Normocephalic and atraumatic.     Mouth/Throat:     Mouth: Mucous membranes are moist.     Pharynx: Oropharynx is clear.  Eyes:     General: No visual field deficit.    Extraocular Movements: Extraocular movements intact.     Conjunctiva/sclera: Conjunctivae normal.     Pupils: Pupils are equal, round, and reactive to light.  Neck:     Trachea: Trachea and phonation normal.  Cardiovascular:     Rate and Rhythm: Normal rate and regular rhythm.     Pulses: Normal pulses.     Heart sounds: Normal heart sounds, S1 normal and S2 normal. No murmur  heard.    No friction rub. No gallop.  Pulmonary:     Effort: Pulmonary effort is normal.     Breath sounds: Normal breath sounds and air entry.  Chest:     Chest wall: No tenderness.  Abdominal:     General: Abdomen is flat. Bowel sounds are normal.     Palpations: Abdomen is soft.     Tenderness: There is no abdominal tenderness.  Musculoskeletal:        General: Normal range of motion.     Cervical back: Normal, full passive range of motion without pain, normal range of motion and neck supple. No spinous process tenderness or muscular tenderness.     Thoracic back: Normal.     Lumbar back: Tenderness and bony tenderness present.     Right hip: Normal.     Left hip: Tenderness present.     Right upper leg: Normal.     Left upper leg: Tenderness present. No edema or deformity.     Right knee: Normal.     Left knee: No swelling or deformity. Tenderness present.     Right lower leg: No edema.     Left lower leg: No edema.     Comments: Tenderness to the midline at approximately L3 to the lumbosacral junction.  Left paraspinal tenderness in the lumbar spine also appreciated in the same distribution.  Tenderness palpation of the left greater trochanter.  Lymphadenopathy:     Cervical: No cervical adenopathy.  Skin:    General: Skin is warm and dry.     Capillary Refill: Capillary refill takes less than 2 seconds.  Neurological:     General: No focal deficit present.     Mental Status: She is alert and oriented to person, place, and time. Mental status is at baseline.     GCS: GCS eye subscore is 4. GCS verbal subscore is 5. GCS motor subscore is 6.     Cranial Nerves: No cranial nerve deficit, dysarthria or facial asymmetry.     Sensory: No sensory deficit.     Motor: No weakness, tremor or abnormal muscle tone.     Comments: Assessment of motor function of the left lower extremity limited by pain, as well unable to assess gait secondary to pain.  Psychiatric:        Mood and  Affect: Mood normal.     (all labs ordered are listed, but only abnormal results are displayed) Labs Reviewed  BASIC METABOLIC PANEL WITH GFR - Abnormal; Notable for the following components:      Result Value   Glucose, Bld 131 (*)    All other components within normal limits  CBC WITH DIFFERENTIAL/PLATELET - Abnormal; Notable for the following  components:   Eosinophils Absolute 0.9 (*)    All other components within normal limits  DRUG SCREEN 10 W/CONF, SERUM    EKG: None  Radiology: DG HIP UNILAT WITH PELVIS 2-3 VIEWS LEFT Result Date: 06/09/2024 CLINICAL DATA:  Slip and fall on water. Left lower extremity pain from the hip to the lower leg. EXAM: LEFT KNEE - 1-2 VIEW; DG HIP (WITH OR WITHOUT PELVIS) 2-3V LEFT; LEFT FEMUR 2 VIEWS; LEFT TIBIA AND FIBULA - 2 VIEW COMPARISON:  None Available. FINDINGS: Pelvis and left hip: No evidence of acute fracture. No hip dislocation. Mild hip osteoarthritis with joint space narrowing and spurring. The pubic rami are intact. Pubic symphysis and sacroiliac joints are congruent with mild degenerative change. Peripheral vascular calcifications are seen. Femur: No acute fracture. Slight cortical thickening about the distal medial femoral diaphyseal cortex, nonspecific but nonacute. Peripheral vascular calcifications are seen. Knee: Knee arthroplasty in expected alignment. No periprosthetic lucency. No acute or periprosthetic fracture. Prior patellar resurfacing. Small joint effusion. Tibia/fibula: No acute fracture. The cortical margins are intact. No ankle dislocation no focal bone abnormality. Mild soft tissue edema. There is a moderate plantar calcaneal spur. IMPRESSION: 1. No acute fracture of the pelvis, left hip, left femur, left knee, or left tibia/fibula. 2. Left knee arthroplasty without complication. 3. Mild left hip osteoarthritis. 4. Peripheral vascular disease. Electronically Signed   By: Andrea Gasman M.D.   On: 06/09/2024 11:17   DG FEMUR  MIN 2 VIEWS LEFT Result Date: 06/09/2024 CLINICAL DATA:  Slip and fall on water. Left lower extremity pain from the hip to the lower leg. EXAM: LEFT KNEE - 1-2 VIEW; DG HIP (WITH OR WITHOUT PELVIS) 2-3V LEFT; LEFT FEMUR 2 VIEWS; LEFT TIBIA AND FIBULA - 2 VIEW COMPARISON:  None Available. FINDINGS: Pelvis and left hip: No evidence of acute fracture. No hip dislocation. Mild hip osteoarthritis with joint space narrowing and spurring. The pubic rami are intact. Pubic symphysis and sacroiliac joints are congruent with mild degenerative change. Peripheral vascular calcifications are seen. Femur: No acute fracture. Slight cortical thickening about the distal medial femoral diaphyseal cortex, nonspecific but nonacute. Peripheral vascular calcifications are seen. Knee: Knee arthroplasty in expected alignment. No periprosthetic lucency. No acute or periprosthetic fracture. Prior patellar resurfacing. Small joint effusion. Tibia/fibula: No acute fracture. The cortical margins are intact. No ankle dislocation no focal bone abnormality. Mild soft tissue edema. There is a moderate plantar calcaneal spur. IMPRESSION: 1. No acute fracture of the pelvis, left hip, left femur, left knee, or left tibia/fibula. 2. Left knee arthroplasty without complication. 3. Mild left hip osteoarthritis. 4. Peripheral vascular disease. Electronically Signed   By: Andrea Gasman M.D.   On: 06/09/2024 11:17   DG Tibia/Fibula Left Result Date: 06/09/2024 CLINICAL DATA:  Slip and fall on water. Left lower extremity pain from the hip to the lower leg. EXAM: LEFT KNEE - 1-2 VIEW; DG HIP (WITH OR WITHOUT PELVIS) 2-3V LEFT; LEFT FEMUR 2 VIEWS; LEFT TIBIA AND FIBULA - 2 VIEW COMPARISON:  None Available. FINDINGS: Pelvis and left hip: No evidence of acute fracture. No hip dislocation. Mild hip osteoarthritis with joint space narrowing and spurring. The pubic rami are intact. Pubic symphysis and sacroiliac joints are congruent with mild degenerative  change. Peripheral vascular calcifications are seen. Femur: No acute fracture. Slight cortical thickening about the distal medial femoral diaphyseal cortex, nonspecific but nonacute. Peripheral vascular calcifications are seen. Knee: Knee arthroplasty in expected alignment. No periprosthetic lucency. No acute or periprosthetic fracture.  Prior patellar resurfacing. Small joint effusion. Tibia/fibula: No acute fracture. The cortical margins are intact. No ankle dislocation no focal bone abnormality. Mild soft tissue edema. There is a moderate plantar calcaneal spur. IMPRESSION: 1. No acute fracture of the pelvis, left hip, left femur, left knee, or left tibia/fibula. 2. Left knee arthroplasty without complication. 3. Mild left hip osteoarthritis. 4. Peripheral vascular disease. Electronically Signed   By: Andrea Gasman M.D.   On: 06/09/2024 11:17   DG Knee 1-2 Views Left Result Date: 06/09/2024 CLINICAL DATA:  Slip and fall on water. Left lower extremity pain from the hip to the lower leg. EXAM: LEFT KNEE - 1-2 VIEW; DG HIP (WITH OR WITHOUT PELVIS) 2-3V LEFT; LEFT FEMUR 2 VIEWS; LEFT TIBIA AND FIBULA - 2 VIEW COMPARISON:  None Available. FINDINGS: Pelvis and left hip: No evidence of acute fracture. No hip dislocation. Mild hip osteoarthritis with joint space narrowing and spurring. The pubic rami are intact. Pubic symphysis and sacroiliac joints are congruent with mild degenerative change. Peripheral vascular calcifications are seen. Femur: No acute fracture. Slight cortical thickening about the distal medial femoral diaphyseal cortex, nonspecific but nonacute. Peripheral vascular calcifications are seen. Knee: Knee arthroplasty in expected alignment. No periprosthetic lucency. No acute or periprosthetic fracture. Prior patellar resurfacing. Small joint effusion. Tibia/fibula: No acute fracture. The cortical margins are intact. No ankle dislocation no focal bone abnormality. Mild soft tissue edema. There is a  moderate plantar calcaneal spur. IMPRESSION: 1. No acute fracture of the pelvis, left hip, left femur, left knee, or left tibia/fibula. 2. Left knee arthroplasty without complication. 3. Mild left hip osteoarthritis. 4. Peripheral vascular disease. Electronically Signed   By: Andrea Gasman M.D.   On: 06/09/2024 11:17   CT Head Wo Contrast Result Date: 06/09/2024 EXAM: CT HEAD WITHOUT CONTRAST 06/09/2024 10:40:47 AM TECHNIQUE: CT of the head was performed without the administration of intravenous contrast. Automated exposure control, iterative reconstruction, and/or weight based adjustment of the mA/kV was utilized to reduce the radiation dose to as low as reasonably achievable. COMPARISON: MRI head 01/17/2020. CLINICAL HISTORY: Head trauma, minor (Age >= 65y). FINDINGS: BRAIN AND VENTRICLES: No acute hemorrhage. No evidence of acute infarct. No hydrocephalus. No extra-axial collection. No mass effect or midline shift. Atherosclerotic calcifications in cavernous internal carotid arteries. ORBITS: Bilateral lens replacement noted. SINUSES: No acute abnormality. SOFT TISSUES AND SKULL: No acute soft tissue abnormality. No skull fracture. Hyperostosis frontalis internus. IMPRESSION: 1. No acute intracranial abnormality. Electronically signed by: Donnice Mania MD 06/09/2024 11:11 AM EST RP Workstation: [id number]     Procedures   Medications Ordered in the ED  morphine  (PF) 4 MG/ML injection 4 mg (4 mg Intravenous Given 06/09/24 0926)  ondansetron  (ZOFRAN ) injection 4 mg (4 mg Intravenous Given 06/09/24 0925)                                    Medical Decision Making Amount and/or Complexity of Data Reviewed Labs: ordered. Radiology: ordered.  Risk Prescription drug management.   Medical Decision Making:   Abigail Townsend is a 68 y.o. female who presented to the ED today with left hip, lower back, knee pain after a fall detailed above.    Handoff received from EMS.  Complete initial  physical exam performed, notably the patient  was alert and oriented no apparent distress.  Exquisitely tender to the left anterior knee, tenderness to the left thigh, tenderness to  the greater trochanter on the left.  Midline spinal and left paraspinal tenderness to the lumbar spine..    Reviewed and confirmed nursing documentation for past medical history, family history, social history.    Initial Assessment:   With the patient's presentation of left hip, knee, thigh pain post fall, consider acute fracture of the left hip, along with femur fracture, acute bony injury to the left knee, given hyperflexion of the left knee consider ligamentous tear/sprain of the left knee, muscular strain of the left thigh.  Also given age, consider possible intracranial bleed as well as C-spine injury.   Initial Plan:  CT imaging of the head and neck to exclude intracranial bleed as well as C-spine injury. Plain film imaging of the pelvis with isolated left hip, left femur, left knee, left tib-fib to evaluate for bony injuries to the same. Screening labs including CBC and Metabolic panel to evaluate for infectious or metabolic etiology of disease.  As requested per work, evaluate drug screen. Initial pain management with IV morphine  as noted. Objective evaluation as below reviewed   Initial Study Results:   Laboratory  All laboratory results reviewed without evidence of clinically relevant pathology.   Exceptions include: None   Radiology:  All images reviewed independently. Agree with radiology report at this time.   DG HIP UNILAT WITH PELVIS 2-3 VIEWS LEFT Result Date: 06/09/2024 CLINICAL DATA:  Slip and fall on water. Left lower extremity pain from the hip to the lower leg. EXAM: LEFT KNEE - 1-2 VIEW; DG HIP (WITH OR WITHOUT PELVIS) 2-3V LEFT; LEFT FEMUR 2 VIEWS; LEFT TIBIA AND FIBULA - 2 VIEW COMPARISON:  None Available. FINDINGS: Pelvis and left hip: No evidence of acute fracture. No hip dislocation.  Mild hip osteoarthritis with joint space narrowing and spurring. The pubic rami are intact. Pubic symphysis and sacroiliac joints are congruent with mild degenerative change. Peripheral vascular calcifications are seen. Femur: No acute fracture. Slight cortical thickening about the distal medial femoral diaphyseal cortex, nonspecific but nonacute. Peripheral vascular calcifications are seen. Knee: Knee arthroplasty in expected alignment. No periprosthetic lucency. No acute or periprosthetic fracture. Prior patellar resurfacing. Small joint effusion. Tibia/fibula: No acute fracture. The cortical margins are intact. No ankle dislocation no focal bone abnormality. Mild soft tissue edema. There is a moderate plantar calcaneal spur. IMPRESSION: 1. No acute fracture of the pelvis, left hip, left femur, left knee, or left tibia/fibula. 2. Left knee arthroplasty without complication. 3. Mild left hip osteoarthritis. 4. Peripheral vascular disease. Electronically Signed   By: Andrea Gasman M.D.   On: 06/09/2024 11:17   DG FEMUR MIN 2 VIEWS LEFT Result Date: 06/09/2024 CLINICAL DATA:  Slip and fall on water. Left lower extremity pain from the hip to the lower leg. EXAM: LEFT KNEE - 1-2 VIEW; DG HIP (WITH OR WITHOUT PELVIS) 2-3V LEFT; LEFT FEMUR 2 VIEWS; LEFT TIBIA AND FIBULA - 2 VIEW COMPARISON:  None Available. FINDINGS: Pelvis and left hip: No evidence of acute fracture. No hip dislocation. Mild hip osteoarthritis with joint space narrowing and spurring. The pubic rami are intact. Pubic symphysis and sacroiliac joints are congruent with mild degenerative change. Peripheral vascular calcifications are seen. Femur: No acute fracture. Slight cortical thickening about the distal medial femoral diaphyseal cortex, nonspecific but nonacute. Peripheral vascular calcifications are seen. Knee: Knee arthroplasty in expected alignment. No periprosthetic lucency. No acute or periprosthetic fracture. Prior patellar resurfacing.  Small joint effusion. Tibia/fibula: No acute fracture. The cortical margins are intact. No ankle dislocation  no focal bone abnormality. Mild soft tissue edema. There is a moderate plantar calcaneal spur. IMPRESSION: 1. No acute fracture of the pelvis, left hip, left femur, left knee, or left tibia/fibula. 2. Left knee arthroplasty without complication. 3. Mild left hip osteoarthritis. 4. Peripheral vascular disease. Electronically Signed   By: Andrea Gasman M.D.   On: 06/09/2024 11:17   DG Tibia/Fibula Left Result Date: 06/09/2024 CLINICAL DATA:  Slip and fall on water. Left lower extremity pain from the hip to the lower leg. EXAM: LEFT KNEE - 1-2 VIEW; DG HIP (WITH OR WITHOUT PELVIS) 2-3V LEFT; LEFT FEMUR 2 VIEWS; LEFT TIBIA AND FIBULA - 2 VIEW COMPARISON:  None Available. FINDINGS: Pelvis and left hip: No evidence of acute fracture. No hip dislocation. Mild hip osteoarthritis with joint space narrowing and spurring. The pubic rami are intact. Pubic symphysis and sacroiliac joints are congruent with mild degenerative change. Peripheral vascular calcifications are seen. Femur: No acute fracture. Slight cortical thickening about the distal medial femoral diaphyseal cortex, nonspecific but nonacute. Peripheral vascular calcifications are seen. Knee: Knee arthroplasty in expected alignment. No periprosthetic lucency. No acute or periprosthetic fracture. Prior patellar resurfacing. Small joint effusion. Tibia/fibula: No acute fracture. The cortical margins are intact. No ankle dislocation no focal bone abnormality. Mild soft tissue edema. There is a moderate plantar calcaneal spur. IMPRESSION: 1. No acute fracture of the pelvis, left hip, left femur, left knee, or left tibia/fibula. 2. Left knee arthroplasty without complication. 3. Mild left hip osteoarthritis. 4. Peripheral vascular disease. Electronically Signed   By: Andrea Gasman M.D.   On: 06/09/2024 11:17   DG Knee 1-2 Views Left Result Date:  06/09/2024 CLINICAL DATA:  Slip and fall on water. Left lower extremity pain from the hip to the lower leg. EXAM: LEFT KNEE - 1-2 VIEW; DG HIP (WITH OR WITHOUT PELVIS) 2-3V LEFT; LEFT FEMUR 2 VIEWS; LEFT TIBIA AND FIBULA - 2 VIEW COMPARISON:  None Available. FINDINGS: Pelvis and left hip: No evidence of acute fracture. No hip dislocation. Mild hip osteoarthritis with joint space narrowing and spurring. The pubic rami are intact. Pubic symphysis and sacroiliac joints are congruent with mild degenerative change. Peripheral vascular calcifications are seen. Femur: No acute fracture. Slight cortical thickening about the distal medial femoral diaphyseal cortex, nonspecific but nonacute. Peripheral vascular calcifications are seen. Knee: Knee arthroplasty in expected alignment. No periprosthetic lucency. No acute or periprosthetic fracture. Prior patellar resurfacing. Small joint effusion. Tibia/fibula: No acute fracture. The cortical margins are intact. No ankle dislocation no focal bone abnormality. Mild soft tissue edema. There is a moderate plantar calcaneal spur. IMPRESSION: 1. No acute fracture of the pelvis, left hip, left femur, left knee, or left tibia/fibula. 2. Left knee arthroplasty without complication. 3. Mild left hip osteoarthritis. 4. Peripheral vascular disease. Electronically Signed   By: Andrea Gasman M.D.   On: 06/09/2024 11:17   CT Head Wo Contrast Result Date: 06/09/2024 EXAM: CT HEAD WITHOUT CONTRAST 06/09/2024 10:40:47 AM TECHNIQUE: CT of the head was performed without the administration of intravenous contrast. Automated exposure control, iterative reconstruction, and/or weight based adjustment of the mA/kV was utilized to reduce the radiation dose to as low as reasonably achievable. COMPARISON: MRI head 01/17/2020. CLINICAL HISTORY: Head trauma, minor (Age >= 65y). FINDINGS: BRAIN AND VENTRICLES: No acute hemorrhage. No evidence of acute infarct. No hydrocephalus. No extra-axial  collection. No mass effect or midline shift. Atherosclerotic calcifications in cavernous internal carotid arteries. ORBITS: Bilateral lens replacement noted. SINUSES: No acute abnormality. SOFT  TISSUES AND SKULL: No acute soft tissue abnormality. No skull fracture. Hyperostosis frontalis internus. IMPRESSION: 1. No acute intracranial abnormality. Electronically signed by: Donnice Mania MD 06/09/2024 11:11 AM EST RP Workstation: [id number]    Reassessment and Plan:   Reevaluated after imaging, she continues to have left hip and thigh pain however this is reduced secondary to IV analgesia.  Imaging does not demonstrate any acute fractures or other bony injuries at this time.  Given these reassuring findings of the imaging as well as the physical exam, find that these are likely consistent with contusion of the left hip as well as strain of the muscles of the anterior left thigh and likely ligamentous injury of the left knee.  As a result have placed a knee immobilizer on the left leg, she has mobilization aids at home, will refer to orthopedics for outpatient follow-up.  This was thoroughly discussed with the patient, she understands agrees has no further concerns at this time.  Will prescribe a short course of p.o. 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  Encouraged use of ibuprofen and only to use hydrocodone  as needed for pain above and beyond what is managed with acetaminophen .  Cautions given using concurrent acetaminophen  with hydrocodone  as not to exceed 4 g/day.       Final diagnoses:  Contusion of left hip and thigh, initial encounter  Sprain of left knee, unspecified ligament, initial encounter  Fall, initial encounter    ED Discharge Orders          Ordered    HYDROcodone -acetaminophen  (NORCO/VICODIN) 5-325 MG tablet  Every 4 hours PRN        06/09/24 1139               Myriam Dorn BROCKS, [place] 06/09/24 1139    Rogelia Jerilynn RAMAN, MD 06/09/24 1211
